# Patient Record
Sex: Male | Born: 1950 | Race: White | Hispanic: No | Marital: Married | Smoking: Never smoker
Health system: Southern US, Community
[De-identification: ages and names within clinical notes are randomized; demographics above are authoritative.]

## PROBLEM LIST (undated history)

## (undated) DIAGNOSIS — T7840XA Allergy, unspecified, initial encounter: Secondary | ICD-10-CM

## (undated) HISTORY — DX: Allergy, unspecified, initial encounter: T78.40XA

## (undated) HISTORY — PX: TONSILLECTOMY: SUR1361

## (undated) HISTORY — PX: INGUINAL HERNIA REPAIR: SUR1180

---

## 2014-11-05 ENCOUNTER — Ambulatory Visit: Payer: Federal, State, Local not specified - PPO | Admitting: *Deleted

## 2014-11-05 ENCOUNTER — Ambulatory Visit
Admission: RE | Admit: 2014-11-05 | Discharge: 2014-11-05 | Disposition: A | Discharge status: Home / Self Care | Payer: Federal, State, Local not specified - PPO | Source: Ambulatory Visit | Attending: Unknown Physician Specialty | Admitting: Unknown Physician Specialty

## 2014-11-05 ENCOUNTER — Encounter
Admission: RE | Disposition: A | Discharge status: Home / Self Care | Payer: Self-pay | Source: Ambulatory Visit | Attending: Unknown Physician Specialty

## 2014-11-05 ENCOUNTER — Encounter: Payer: Self-pay | Admitting: *Deleted

## 2014-11-05 DIAGNOSIS — K640 First degree hemorrhoids: Secondary | ICD-10-CM | POA: Insufficient documentation

## 2014-11-05 DIAGNOSIS — K5730 Diverticulosis of large intestine without perforation or abscess without bleeding: Secondary | ICD-10-CM | POA: Insufficient documentation

## 2014-11-05 DIAGNOSIS — Z1211 Encounter for screening for malignant neoplasm of colon: Secondary | ICD-10-CM | POA: Insufficient documentation

## 2014-11-05 DIAGNOSIS — Z7982 Long term (current) use of aspirin: Secondary | ICD-10-CM | POA: Insufficient documentation

## 2014-11-05 HISTORY — PX: COLONOSCOPY WITH PROPOFOL: SHX5780

## 2014-11-05 SURGERY — COLONOSCOPY WITH PROPOFOL
Anesthesia: General

## 2014-11-05 MED ORDER — SODIUM CHLORIDE 0.9 % IV SOLN: INTRAVENOUS | Status: DC

## 2014-11-05 MED ORDER — PROPOFOL INFUSION 10 MG/ML OPTIME
INTRAVENOUS | Status: DC | PRN
  Administered 2014-11-05: 120 ug/kg/min via INTRAVENOUS

## 2014-11-05 MED ORDER — SODIUM CHLORIDE 0.9 % IV SOLN
INTRAVENOUS | Status: DC
  Administered 2014-11-05: 1000 mL via INTRAVENOUS
  Administered 2014-11-05: 15:00:00 via INTRAVENOUS

## 2014-11-05 MED ORDER — LIDOCAINE HCL (CARDIAC) 20 MG/ML IV SOLN
INTRAVENOUS | Status: DC | PRN
  Administered 2014-11-05: 60 mg via INTRAVENOUS

## 2014-11-05 MED ORDER — EPHEDRINE SULFATE 50 MG/ML IJ SOLN
INTRAMUSCULAR | Status: DC | PRN
  Administered 2014-11-05: 10 mg via INTRAVENOUS

## 2014-11-05 MED ORDER — PROPOFOL 10 MG/ML IV BOLUS
INTRAVENOUS | Status: DC | PRN
  Administered 2014-11-05: 90 mg via INTRAVENOUS

## 2014-11-05 MED ORDER — MIDAZOLAM HCL 2 MG/2ML IJ SOLN
INTRAMUSCULAR | Status: DC | PRN
  Administered 2014-11-05: 1 mg via INTRAVENOUS

## 2014-11-05 NOTE — Anesthesia Preprocedure Evaluation (Signed)
Anesthesia Evaluation  Patient identified by MRN, date of birth, ID band Patient awake    Reviewed: Allergy & Precautions, NPO status , Patient's Chart, lab work & pertinent test results  Airway Mallampati: I  TM Distance: >3 FB Neck ROM: Full    Dental  (+) Teeth Intact   Pulmonary    Pulmonary exam normal       Cardiovascular Exercise Tolerance: Good negative cardio ROS Normal cardiovascular exam    Neuro/Psych    GI/Hepatic   Endo/Other    Renal/GU      Musculoskeletal   Abdominal Normal abdominal exam  (+)   Peds  Hematology   Anesthesia Other Findings   Reproductive/Obstetrics                             Anesthesia Physical Anesthesia Plan  ASA: I  Anesthesia Plan: General   Post-op Pain Management:    Induction: Intravenous  Airway Management Planned: Nasal Cannula  Additional Equipment:   Intra-op Plan:   Post-operative Plan:   Informed Consent: I have reviewed the patients History and Physical, chart, labs and discussed the procedure including the risks, benefits and alternatives for the proposed anesthesia with the patient or authorized representative who has indicated his/her understanding and acceptance.     Plan Discussed with: CRNA  Anesthesia Plan Comments:         Anesthesia Quick Evaluation

## 2014-11-05 NOTE — Transfer of Care (Signed)
Immediate Anesthesia Transfer of Care Note  Patient: Travis Fernandez  Procedure(s) Performed: Procedure(s): COLONOSCOPY WITH PROPOFOL (N/A)  Patient Location: PACU  Anesthesia Type:General  Level of Consciousness: sedated  Airway & Oxygen Therapy: Patient Spontanous Breathing and Patient connected to nasal cannula oxygen  Post-op Assessment: Report given to RN  Post vital signs: Reviewed and stable  Last Vitals:  Filed Vitals:   11/05/14 1528  BP: 86/59  Pulse: 59  Temp: 36.3 C  Resp: 16    Complications: No apparent anesthesia complications

## 2014-11-05 NOTE — H&P (Signed)
Primary Care Physician:  Barbette Reichmann, MD Primary Gastroenterologist:  Dr. Mechele Collin  Pre-Procedure History & Physical: HPI:  Travis Fernandez is a 64 y.o. male is here for an colonoscopy.   History reviewed. No pertinent past medical history.  Past Surgical History  Procedure Laterality Date  . Inguinal hernia repair Left   . Tonsillectomy      Prior to Admission medications   Medication Sig Start Date End Date Taking? Authorizing Provider  aspirin 81 MG tablet Take 81 mg by mouth daily.   Yes Historical Provider, MD    Allergies as of 09/29/2014  . (Not on File)    History reviewed. No pertinent family history.  History   Social History  . Marital Status: Married    Spouse Name: N/A  . Number of Children: N/A  . Years of Education: N/A   Occupational History  . Not on file.   Social History Main Topics  . Smoking status: Never Smoker   . Smokeless tobacco: Not on file  . Alcohol Use: No  . Drug Use: No  . Sexual Activity: Not on file   Other Topics Concern  . Not on file   Social History Narrative  . No narrative on file    Review of Systems: See HPI, otherwise negative ROS  Physical Exam: BP 88/61 mmHg  Pulse 60  Temp(Src) 97.4 F (36.3 C) (Tympanic)  Resp 16  Ht 5\' 5"  (1.651 m)  Wt 69.4 kg (153 lb)  BMI 25.46 kg/m2  SpO2 98% General:   Alert,  pleasant and cooperative in NAD Head:  Normocephalic and atraumatic. Neck:  Supple; no masses or thyromegaly. Lungs:  Clear throughout to auscultation.    Heart:  Regular rate and rhythm. Abdomen:  Soft, nontender and nondistended. Normal bowel sounds, without guarding, and without rebound.   Neurologic:  Alert and  oriented x4;  grossly normal neurologically.  Impression/Plan: Travis Fernandez is here for an colonoscopy to be performed for screening   Risks, benefits, limitations, and alternatives regarding  colonoscopy have been reviewed with the patient.  Questions have been answered.  All parties  agreeable.   Lynnae Prude, MD  11/05/2014, 3:42 PM   Primary Care Physician:  Barbette Reichmann, MD Primary Gastroenterologist:  Dr. Mechele Collin  Pre-Procedure History & Physical: HPI:  Travis Fernandez is a 64 y.o. male is here for an colonoscopy.   History reviewed. No pertinent past medical history.  Past Surgical History  Procedure Laterality Date  . Inguinal hernia repair Left   . Tonsillectomy      Prior to Admission medications   Medication Sig Start Date End Date Taking? Authorizing Provider  aspirin 81 MG tablet Take 81 mg by mouth daily.   Yes Historical Provider, MD    Allergies as of 09/29/2014  . (Not on File)    History reviewed. No pertinent family history.  History   Social History  . Marital Status: Married    Spouse Name: N/A  . Number of Children: N/A  . Years of Education: N/A   Occupational History  . Not on file.   Social History Main Topics  . Smoking status: Never Smoker   . Smokeless tobacco: Not on file  . Alcohol Use: No  . Drug Use: No  . Sexual Activity: Not on file   Other Topics Concern  . Not on file   Social History Narrative  . No narrative on file    Review of Systems: See HPI, otherwise negative ROS  Physical  Exam: BP 88/61 mmHg  Pulse 60  Temp(Src) 97.4 F (36.3 C) (Tympanic)  Resp 16  Ht  (1.651 m)  Wt 69.4 kg (153 lb)  BMI 25.46 kg/m2  SpO2 98% General:   Alert,  pleasant and cooperative in NAD Head:  Normocephalic and atraumatic. Neck:  Supple; no masses or thyromegaly. Lungs:  Clear throughout to auscultation.    Heart:  Regular rate and rhythm. Abdomen:  Soft, nontender and nondistended. Normal bowel sounds, without guarding, and without rebound.   Neurologic:  Alert and  oriented x4;  grossly normal neurologically.  Impression/Plan: Travis Fernandez is here for an colonoscopy to be performed for screening  Risks, benefits, limitations, and alternatives regarding  colonoscopy have been reviewed with the  patient.  Questions have been answered.  All parties agreeable.   Lynnae Prude, MD  11/05/2014, 3:42 PM

## 2014-11-05 NOTE — Anesthesia Postprocedure Evaluation (Signed)
  Anesthesia Post-op Note  Patient: Travis Fernandez  Procedure(s) Performed: Procedure(s): COLONOSCOPY WITH PROPOFOL (N/A)  Anesthesia type:General  Patient location: PACU  Post pain: Pain level controlled  Post assessment: Post-op Vital signs reviewed, Patient's Cardiovascular Status Stable, Respiratory Function Stable, Patent Airway and No signs of Nausea or vomiting  Post vital signs: Reviewed and stable  Last Vitals:  Filed Vitals:   11/05/14 1358  BP: 139/86  Pulse: 84  Temp: 37.2 C  Resp: 20    Level of consciousness: awake, alert  and patient cooperative  Complications: No apparent anesthesia complications

## 2014-11-05 NOTE — Op Note (Signed)
[facility] Gastroenterology Patient Name: Travis Fernandez Procedure Date: 11/05/2014 2:40 PM MRN: [id number] Account #: [id number] Date of Birth: Aug 16, 1950 Admit Type: Outpatient Age: 64 Room: [facility] ENDO ROOM 1 Gender: Male Note Status: Finalized Procedure:         Colonoscopy Indications:       Screening for colorectal malignant neoplasm Providers:         Scot Jun, MD Referring MD:      Barbette Reichmann, MD (Referring MD) Medicines:         Propofol per Anesthesia Complications:     No immediate complications. Procedure:         Pre-Anesthesia Assessment:                    - After reviewing the risks and benefits, the patient was                     deemed in satisfactory condition to undergo the procedure.                    After obtaining informed consent, the colonoscope was                     passed under direct vision. Throughout the procedure, the                     patient's blood pressure, pulse, and oxygen saturations                     were monitored continuously. The Colonoscope was                     introduced through the anus and advanced to the the cecum,                     identified by appendiceal orifice and ileocecal valve. The                     colonoscopy was performed without difficulty. The patient                     tolerated the procedure well. The quality of the bowel                     preparation was good. Findings:      Multiple small-mouthed diverticula were found in the sigmoid colon.      Internal hemorrhoids were found during endoscopy. The hemorrhoids were       small, medium-sized and Grade I (internal hemorrhoids that do not       prolapse).      The exam was otherwise without abnormality. Impression:        - Diverticulosis in the sigmoid colon.                    - Internal hemorrhoids.                    - The examination was otherwise normal.                    - No specimens collected. Recommendation:     - Repeat colonoscopy in 10 years for screening purposes. Scot Jun, MD 11/05/2014 3:23:55 PM This report has been signed electronically. Number of Addenda: 0 Note Initiated On: 11/05/2014 2:40  PM Scope Withdrawal Time: 0 hours 8 minutes 15 seconds  Total Procedure Duration: 0 hours 17 minutes 30 seconds       [facility]

## 2014-11-08 ENCOUNTER — Encounter: Payer: Self-pay | Admitting: Unknown Physician Specialty

## 2017-06-20 ENCOUNTER — Other Ambulatory Visit: Payer: Self-pay | Admitting: Internal Medicine

## 2017-06-20 DIAGNOSIS — H9319 Tinnitus, unspecified ear: Secondary | ICD-10-CM

## 2017-06-20 DIAGNOSIS — G44221 Chronic tension-type headache, intractable: Secondary | ICD-10-CM

## 2017-07-04 ENCOUNTER — Ambulatory Visit: Payer: Federal, State, Local not specified - PPO

## 2017-07-08 ENCOUNTER — Ambulatory Visit
Admission: RE | Admit: 2017-07-08 | Discharge: 2017-07-08 | Disposition: A | Discharge status: Home / Self Care | Payer: Medicare Other | Source: Ambulatory Visit | Attending: Internal Medicine | Admitting: Internal Medicine

## 2017-07-08 DIAGNOSIS — R51 Headache: Secondary | ICD-10-CM | POA: Insufficient documentation

## 2017-07-08 DIAGNOSIS — H9319 Tinnitus, unspecified ear: Secondary | ICD-10-CM | POA: Insufficient documentation

## 2017-07-08 DIAGNOSIS — G44221 Chronic tension-type headache, intractable: Secondary | ICD-10-CM

## 2017-08-21 ENCOUNTER — Other Ambulatory Visit: Payer: Self-pay | Admitting: Internal Medicine

## 2017-08-21 DIAGNOSIS — Z125 Encounter for screening for malignant neoplasm of prostate: Secondary | ICD-10-CM

## 2017-08-21 DIAGNOSIS — N058 Unspecified nephritic syndrome with other morphologic changes: Secondary | ICD-10-CM

## 2017-08-21 DIAGNOSIS — N400 Enlarged prostate without lower urinary tract symptoms: Secondary | ICD-10-CM

## 2017-08-21 DIAGNOSIS — N289 Disorder of kidney and ureter, unspecified: Secondary | ICD-10-CM

## 2017-08-26 ENCOUNTER — Ambulatory Visit
Admission: RE | Admit: 2017-08-26 | Discharge: 2017-08-26 | Disposition: A | Discharge status: Home / Self Care | Payer: Medicare Other | Source: Ambulatory Visit | Attending: Internal Medicine | Admitting: Internal Medicine

## 2017-08-26 DIAGNOSIS — N058 Unspecified nephritic syndrome with other morphologic changes: Secondary | ICD-10-CM | POA: Diagnosis not present

## 2017-08-26 DIAGNOSIS — N281 Cyst of kidney, acquired: Secondary | ICD-10-CM | POA: Insufficient documentation

## 2017-08-26 DIAGNOSIS — N289 Disorder of kidney and ureter, unspecified: Secondary | ICD-10-CM

## 2017-08-26 DIAGNOSIS — N400 Enlarged prostate without lower urinary tract symptoms: Secondary | ICD-10-CM | POA: Insufficient documentation

## 2017-08-26 DIAGNOSIS — Z125 Encounter for screening for malignant neoplasm of prostate: Secondary | ICD-10-CM | POA: Diagnosis not present

## 2017-10-11 ENCOUNTER — Encounter: Payer: Medicare Other | Attending: Internal Medicine | Admitting: Dietician

## 2017-10-11 ENCOUNTER — Encounter: Payer: Self-pay | Admitting: Dietician

## 2017-10-11 VITALS — Ht 65.0 in | Wt 157.8 lb

## 2017-10-11 DIAGNOSIS — Z888 Allergy status to other drugs, medicaments and biological substances status: Secondary | ICD-10-CM | POA: Diagnosis not present

## 2017-10-11 DIAGNOSIS — F419 Anxiety disorder, unspecified: Secondary | ICD-10-CM | POA: Insufficient documentation

## 2017-10-11 DIAGNOSIS — F329 Major depressive disorder, single episode, unspecified: Secondary | ICD-10-CM | POA: Diagnosis not present

## 2017-10-11 DIAGNOSIS — Z880 Allergy status to penicillin: Secondary | ICD-10-CM | POA: Diagnosis not present

## 2017-10-11 DIAGNOSIS — N400 Enlarged prostate without lower urinary tract symptoms: Secondary | ICD-10-CM | POA: Diagnosis not present

## 2017-10-11 DIAGNOSIS — N058 Unspecified nephritic syndrome with other morphologic changes: Secondary | ICD-10-CM | POA: Diagnosis not present

## 2017-10-11 DIAGNOSIS — E876 Hypokalemia: Secondary | ICD-10-CM | POA: Diagnosis not present

## 2017-10-11 DIAGNOSIS — N289 Disorder of kidney and ureter, unspecified: Secondary | ICD-10-CM

## 2017-10-11 DIAGNOSIS — Z91013 Allergy to seafood: Secondary | ICD-10-CM | POA: Diagnosis not present

## 2017-10-11 DIAGNOSIS — Z713 Dietary counseling and surveillance: Secondary | ICD-10-CM | POA: Insufficient documentation

## 2017-10-11 NOTE — Patient Instructions (Addendum)
   Aim for 64 oz of fluid per day, low/zero sugar ideally   Low sugar and fat options contain 10g or less per serving  200 mg per serving of an item is considered a lower salt option  Start to include more fruits and vegetables into the diet. Smoothies are a great way to do this  Freeze items like bananas or butternut squash to add  Serving of vegetables: 1/2 cup cooked, 1 cup raw  Consider adding some vegetarian / meatless meals into the week  Low potassium diet: 2000mg  / day  Normal daily recommendations: 3500-4700mg / day

## 2017-10-11 NOTE — Progress Notes (Signed)
Medical Nutrition Therapy: Visit start time: 1030  end time: 1130  Assessment:  Diagnosis: renal insufficiency, hypokalemia Past medical history: anxiety Psychosocial issues/ stress concerns: none  Preferred learning method:  . Auditory . Visual . Hands-on  Current weight: 157.8#  Height: 5\' 5"  Medications, supplements: Fish oil, Saw palmetto, MVI occasionally  Progress and evaluation: Patient is concerned about recent lab values: Creat 1.3, LDL 133, GFR 55. His potassium levels have been fluctuating from low to WNL in recent years as well. Interested in learning about nutrition strategies to improve renal function. Reports to have cysts on both of his kidneys which he suspects may be affecting their function. In order to improve his health in general he has started to incorporate a fish oil supplement, adds anti-inflammatory spices to his foods such as tumeric and cinnamon, does not drink caffeine or soda, and until recently he had been choosing whole grain options. After learning of his renal insufficiency he started to monitor his phosphorus, protein, potassium, sodium and cut out whole grains but is unsure if he needs to be doing so. He and his wife have started to think about including more fruits and vegetables in their diets. Per wife, they do not cook with much salt and eat most meals at home.   Physical activity: walk or cycles for 1 hour 5 days per week  Dietary Intake:  Usual eating pattern includes 3 meals and 2-3 snacks per day. Dining out frequency: 1-2 meals per week.  Breakfast: low sugar cereal with almond milk + honey + blueberries, granola mixed with cereal sometimes, fruit smoothie with spinach Snack: not usually Lunch: sandwich (pb&j, chicken salad, tuna salad, [place]urkey and cheese with honey mustard) Snack: carrots, chips (less often now), peanut butter, unsalted nuts, candy bars occasionally Supper: veggies burgers, meat (chicken mostly, red meat less often recently,  ham), corn or other starchy vegetable, beans, rice, baked potatoes, tomatoes & cucumber Snack: smoothie (grapes, pineapple, apple, peaches, spinach with almond milk) Beverages: water, 1/2 diluted juice  Nutrition Care Education: Topics covered: identifying high sodium foods, dietary sources of potassium and protein, low fat and low sugar guidelines, ways to incorporate more fruits and vegetables into the diet, daily fluid recommendations Basic nutrition: basic food groups, appropriate nutrient balance, appropriate meal and snack schedule, general nutrition guidelines Advanced nutrition: cooking techniques, dining out, food label reading Hyperlipidemia: healthy and unhealthy fats, role of fiber and foods that contain dietary fiber Other lifestyle changes: benefits of making changes, readiness for change, identifying habits that need to change  Nutritional Diagnosis:  [place]-2.2 Altered nutrition-related laboratory As related to renal insufficiency, dietary habits.  As evidenced by Creat 1.3, LDL 133, GFR 55.  Intervention: Discussion as noted above. Patient and his wife agree to start monitoring sodium and fluid intake. They also plan to continue to increase their intake of fruits and vegetables, along with becoming more conscious of protein and potassium intake. They have started to read nutrition facts labels to identify several of these factors.   Education Materials given:  Marland Kitchen. Salt-free seasoning blends for cooking . General diet guidelines for Low Sodium Diet . Low, Moderate, High Potassium Food List . Goals/ instructions  Learner/ who was taught:  . Patient  . Spouse/ partner: wife  Level of understanding: Marland Kitchen. Verbalizes/ demonstrates competency  Demonstrated degree of understanding via:   Teach back Learning barriers: . None  Willingness to learn/ readiness for change: . Eager, change in progress  Monitoring and Evaluation:  Dietary intake, exercise,  renal labs, and body  weight      follow up: prn

## 2018-10-09 DIAGNOSIS — Z8249 Family history of ischemic heart disease and other diseases of the circulatory system: Secondary | ICD-10-CM | POA: Insufficient documentation

## 2020-05-30 ENCOUNTER — Ambulatory Visit (INDEPENDENT_AMBULATORY_CARE_PROVIDER_SITE_OTHER): Payer: Medicare Other | Admitting: Urology

## 2020-05-30 ENCOUNTER — Encounter: Payer: Self-pay | Admitting: Urology

## 2020-05-30 ENCOUNTER — Other Ambulatory Visit: Payer: Self-pay

## 2020-05-30 VITALS — BP 152/92 | HR 91 | Ht 65.0 in | Wt 148.0 lb

## 2020-05-30 DIAGNOSIS — R350 Frequency of micturition: Secondary | ICD-10-CM | POA: Diagnosis not present

## 2020-05-30 LAB — URINALYSIS, COMPLETE
Bilirubin, UA: NEGATIVE
Glucose, UA: NEGATIVE
Ketones, UA: NEGATIVE
Leukocytes,UA: NEGATIVE
Nitrite, UA: NEGATIVE
Protein,UA: NEGATIVE
RBC, UA: NEGATIVE
Specific Gravity, UA: 1.025 (ref 1.005–1.030)
Urobilinogen, Ur: 0.2 mg/dL (ref 0.2–1.0)
pH, UA: 5 (ref 5.0–7.5)

## 2020-05-30 LAB — MICROSCOPIC EXAMINATION
Epithelial Cells (non renal): NONE SEEN /hpf (ref 0–10)
RBC, Urine: NONE SEEN /hpf (ref 0–2)

## 2020-05-30 LAB — BLADDER SCAN AMB NON-IMAGING: Scan Result: 24

## 2020-05-30 NOTE — Progress Notes (Signed)
05/30/2020 8:42 AM   Travis Fernandez 01-07-1951 [id number]  Referring provider: Barbette Reichmann, MD [address] [facility] [place],  [place] [address]  Chief Complaint  Patient presents with  . Urinary Frequency    HPI: I was consulted to assess the patient for increased urinary frequency.  He can void every 30 to 60 minutes if he concentrates on it.  Otherwise he can hold it for 2 hours.  He gets up about twice at night.  He does have insomnia.  Flow is reasonable.  He never tried Flomax since he was concerned with his renal function.  Recent PSA 0.91.  Urine culture negative.  Glomerular filtration rate 55 mL/min with a serum creatinine of 1.3 in 2019 patient had normal kidneys with 11 cm simple cyst midpole right kidney.  No history of kidney stones bladder surgery or bladder infections.  Bowel movements normal.  No neurologic issues   PMH: No past medical history on file.  Surgical History: Past Surgical History:  Procedure Laterality Date  . COLONOSCOPY WITH PROPOFOL N/A 11/05/2014   Procedure: COLONOSCOPY WITH PROPOFOL;  Surgeon: Scot Jun, MD;  Location: [facility] ENDOSCOPY;  Service: Endoscopy;  Laterality: N/A;  . INGUINAL HERNIA REPAIR Left   . TONSILLECTOMY      Home Medications:  Allergies as of 05/30/2020      Reactions   Shellfish-derived Products    Penicillin G Rash   Prednisone Rash      Medication List       Accurate as of May 30, 2020  8:42 AM. If you have any questions, ask your nurse or doctor.        aspirin 81 MG tablet Take 81 mg by mouth daily.       Allergies:  Allergies  Allergen Reactions  . Shellfish-Derived Products   . Penicillin G Rash  . Prednisone Rash    Family History: No family history on file.  Social History:  reports that he has never smoked. He has never used smokeless tobacco. He reports that he does not drink alcohol and does not use drugs.  ROS:                                         Physical Exam: There were no vitals taken for this visit.  Constitutional:  Alert and oriented, No acute distress. HEENT: [place] AT, moist mucus membranes.  Trachea midline, no masses. Cardiovascular: No clubbing, cyanosis, or edema. Respiratory: Normal respiratory effort, no increased work of breathing. GI: Abdomen is soft, nontender, nondistended, no abdominal masses GU: No CVA tenderness.  40 to 50 g benign prostate Skin: No rashes, bruises or suspicious lesions. Lymph: No cervical or inguinal adenopathy. Neurologic: Grossly intact, no focal deficits, moving all 4 extremities. Psychiatric: Normal mood and affect.  Laboratory Data: No results found for: WBC, HGB, HCT, MCV, PLT  No results found for: CREATININE  No results found for: PSA  No results found for: TESTOSTERONE  No results found for: HGBA1C  Urinalysis No results found for: COLORURINE, APPEARANCEUR, LABSPEC, PHURINE, GLUCOSEU, HGBUR, BILIRUBINUR, KETONESUR, PROTEINUR, UROBILINOGEN, NITRITE, LEUKOCYTESUR  Pertinent Imaging: Urine reviewed.  Chart reviewed.  Bladder scan residual 24 mL  Assessment & Plan: Patient has an overactive bladder.  Role of medications discussed.  Role of watchful waiting discussed.  Follow-up with primary care regarding renal function.  I do not think he  needs another renal ultrasound.  I think the patient made a good choice by choosing watchful waiting.  I will see him as needed  1. Urinary frequency  - Urinalysis, Complete   No follow-ups on file.  Martina Sinner, MD  [facility] [address] [place], [place] [address] [phone]

## 2020-08-16 ENCOUNTER — Ambulatory Visit: Payer: Medicare Other | Admitting: Physician Assistant

## 2020-10-27 ENCOUNTER — Other Ambulatory Visit: Payer: Self-pay | Admitting: Physician Assistant

## 2020-10-27 ENCOUNTER — Other Ambulatory Visit: Payer: Self-pay | Admitting: Cardiology

## 2020-10-27 DIAGNOSIS — Z9189 Other specified personal risk factors, not elsewhere classified: Secondary | ICD-10-CM

## 2020-10-27 DIAGNOSIS — R072 Precordial pain: Secondary | ICD-10-CM

## 2020-10-27 DIAGNOSIS — E785 Hyperlipidemia, unspecified: Secondary | ICD-10-CM

## 2020-10-27 DIAGNOSIS — Z136 Encounter for screening for cardiovascular disorders: Secondary | ICD-10-CM

## 2020-11-02 ENCOUNTER — Other Ambulatory Visit: Payer: Self-pay

## 2020-11-02 ENCOUNTER — Ambulatory Visit
Admission: RE | Admit: 2020-11-02 | Discharge: 2020-11-02 | Disposition: A | Discharge status: Home / Self Care | Payer: Medicare Other | Source: Ambulatory Visit | Attending: Physician Assistant | Admitting: Physician Assistant

## 2020-11-02 DIAGNOSIS — E785 Hyperlipidemia, unspecified: Secondary | ICD-10-CM | POA: Insufficient documentation

## 2020-11-02 DIAGNOSIS — Z136 Encounter for screening for cardiovascular disorders: Secondary | ICD-10-CM | POA: Insufficient documentation

## 2020-11-02 DIAGNOSIS — Z9189 Other specified personal risk factors, not elsewhere classified: Secondary | ICD-10-CM | POA: Insufficient documentation

## 2020-11-15 ENCOUNTER — Other Ambulatory Visit: Payer: Self-pay | Admitting: Internal Medicine

## 2020-11-15 DIAGNOSIS — N1831 Chronic kidney disease, stage 3a: Secondary | ICD-10-CM

## 2020-12-01 ENCOUNTER — Ambulatory Visit
Admission: RE | Admit: 2020-12-01 | Discharge: 2020-12-01 | Disposition: A | Discharge status: Home / Self Care | Payer: Medicare Other | Source: Ambulatory Visit | Attending: Internal Medicine | Admitting: Internal Medicine

## 2020-12-01 ENCOUNTER — Other Ambulatory Visit: Payer: Self-pay

## 2020-12-01 DIAGNOSIS — N1831 Chronic kidney disease, stage 3a: Secondary | ICD-10-CM | POA: Insufficient documentation

## 2021-05-23 ENCOUNTER — Ambulatory Visit (INDEPENDENT_AMBULATORY_CARE_PROVIDER_SITE_OTHER): Payer: Medicare Other | Admitting: Vascular Surgery

## 2021-05-23 ENCOUNTER — Encounter (INDEPENDENT_AMBULATORY_CARE_PROVIDER_SITE_OTHER): Payer: Self-pay | Admitting: Vascular Surgery

## 2021-05-23 ENCOUNTER — Other Ambulatory Visit: Payer: Self-pay

## 2021-05-23 DIAGNOSIS — I83891 Varicose veins of right lower extremities with other complications: Secondary | ICD-10-CM | POA: Diagnosis not present

## 2021-05-23 NOTE — Progress Notes (Signed)
Patient ID: Travis Fernandez, male   DOB: July 29, 1950, 71 y.o.   MRN: [id number]  Chief Complaint  Patient presents with   New Patient (Initial Visit)    Ref Hande right le varicose veins    HPI Travis Fernandez is a 71 y.o. male.  I am asked to see the patient by Dr. Ginette Pitman for evaluation of large varicose veins of the right lower extremity.  The patient has had these for many years and they have been gradually worsening.  He really does not have a lot of pain or swelling in his legs.  He may notice a little bit of heaviness and fatigue in that leg, but he says it really is not bothering him much at all.  No open wounds or infection.  No chest pain or shortness of breath.  No fevers or chills.  No previous history of DVT or superficial thrombophlebitis to his knowledge.     Past Medical History:  Diagnosis Date   Allergy     Past Surgical History:  Procedure Laterality Date   COLONOSCOPY WITH PROPOFOL N/A 11/05/2014   Procedure: COLONOSCOPY WITH PROPOFOL;  Surgeon: Manya Silvas, MD;  Location: [facility] ENDOSCOPY;  Service: Endoscopy;  Laterality: N/A;   INGUINAL HERNIA REPAIR Left    TONSILLECTOMY       Family History  Problem Relation Age of Onset   Stroke Mother    Stroke Father    Heart attack Father   No bleeding or clotting disorders   Social History   Tobacco Use   Smoking status: Never   Smokeless tobacco: Never  Substance Use Topics   Alcohol use: No   Drug use: No     Allergies  Allergen Reactions   Erythromycin Diarrhea   Shellfish-Derived Products    Niacin Hives and Rash    Full body Full body    Penicillin G Rash   Prednisone Rash    Current Outpatient Medications  Medication Sig Dispense Refill   Ascorbic Acid 500 MG CHEW Take 1 tablet (500 mg total) by mouth once daily     cyanocobalamin 1000 MCG tablet Take 1 tablet (1,000 mcg total) by mouth once daily     Magnesium 200 MG TABS Take 3 tablets by mouth daily.     Menaquinone-7 (K2-45 PO) Take by  mouth daily.     Omega-3 Fatty Acids (FISH OIL) 1000 MG CAPS Take 3 capsules by mouth daily.     saw palmetto 160 MG capsule Take 160 mg by mouth 2 (two) times daily.     vitamin E 180 MG (400 UNITS) capsule Take 400 Units by mouth daily.     No current facility-administered medications for this visit.      REVIEW OF SYSTEMS (Negative unless checked)  Constitutional: [] Weight loss  [] Fever  [] Chills Cardiac: [] Chest pain   [] Chest pressure   [] Palpitations   [] Shortness of breath when laying flat   [] Shortness of breath at rest   [] Shortness of breath with exertion. Vascular:  [] Pain in legs with walking   [] Pain in legs at rest   [] Pain in legs when laying flat   [] Claudication   [] Pain in feet when walking  [] Pain in feet at rest  [] Pain in feet when laying flat   [] History of DVT   [] Phlebitis   [] Swelling in legs   [x] Varicose veins   [] Non-healing ulcers Pulmonary:   [] Uses home oxygen   [] Productive cough   [] Hemoptysis   [] Wheeze  []   COPD   [] Asthma Neurologic:  [] Dizziness  [] Blackouts   [] Seizures   [] History of stroke   [] History of TIA  [] Aphasia   [] Temporary blindness   [] Dysphagia   [] Weakness or numbness in arms   [] Weakness or numbness in legs Musculoskeletal:  [] Arthritis   [] Joint swelling   [] Joint pain   [] Low back pain Hematologic:  [] Easy bruising  [] Easy bleeding   [] Hypercoagulable state   [] Anemic  [] Hepatitis Gastrointestinal:  [] Blood in stool   [] Vomiting blood  [] Gastroesophageal reflux/heartburn   [] Abdominal pain Genitourinary:  [] Chronic kidney disease   [] Difficult urination  [] Frequent urination  [] Burning with urination   [] Hematuria Skin:  [] Rashes   [] Ulcers   [] Wounds Psychological:  [] History of anxiety   []  History of major depression.    Physical Exam BP (!) 143/84 (BP Location: Left Arm)    Pulse 83    Resp 16    Ht 5' 5.5" (1.664 m)    Wt 148 lb (67.1 kg)    BMI 24.25 kg/m  Gen:  WD/WN, NAD.  Appears younger than stated age Head: [place]/AT, No  temporalis wasting.  Ear/Nose/Throat: Hearing grossly intact, nares w/o erythema or drainage, oropharynx w/o Erythema/Exudate Eyes: Conjunctiva clear, sclera non-icteric  Neck: trachea midline.  No JVD.  Pulmonary:  Good air movement, respirations not labored, no use of accessory muscles  Cardiac: RRR, no JVD Vascular: Extensive varicosities in the right medial knee and lower leg area with mild stasis dermatitis changes in the right lower extremity.  Scattered varicosities present on the left much smaller. Vessel Right Left  Radial Palpable Palpable                          DP Palpable Palpable  PT Palpable Palpable   Gastrointestinal:. No masses, surgical incisions, or scars. Musculoskeletal: M/S 5/5 throughout.  Extremities without ischemic changes.  No deformity or atrophy.  Varicosities as above.  No significant lower extremity edema. Neurologic: Sensation grossly intact in extremities.  Symmetrical.  Speech is fluent. Motor exam as listed above. Psychiatric: Judgment intact, Mood & affect appropriate for pt's clinical situation. Dermatologic: No rashes or ulcers noted.  No cellulitis or open wounds.    Radiology No results found.  Labs No results found for this or any previous visit (from the past 2160 hour(s)).  Assessment/Plan:  Varicose veins of lower extremities with complications, right The patient has prominent varicosities the right lower extremity but currently does not have much in the way of symptoms.  Does have some heaviness and fatigue.  He is wearing a compression sock and will continue to do so.  He will continue to be active and elevate his legs.  I have offered him a reflux study to evaluate the anatomy and he says his legs start bothering him more, he will have this done for further work-up.      Leotis Pain 05/23/2021, 11:59 AM   This note was created with Dragon medical transcription system.  Any errors from dictation are unintentional.

## 2021-05-23 NOTE — Patient Instructions (Signed)
Varicose Veins °Varicose veins are veins that have become enlarged, bulged, and twisted. They most often appear in the legs. °What are the causes? °This condition is caused by damage to the valves in the vein. These valves help blood return to your heart. When they are damaged and they stop working properly, blood may flow backward and back up in the veins near the skin, causing the veins to get larger and appear twisted. °The condition can result from any issue that causes blood to back up, like pregnancy, prolonged standing, or obesity. °What increases the risk? °The following factors may make you more likely to develop this condition: °Being on your feet a lot. °Being pregnant. °Being overweight. °Smoking. °Having had a previous deep vein thrombosis or having a thrombotic disorder. °Aging. The risk increases with age. °Having a condition called Klippel-Trenaunay syndrome. °What are the signs or symptoms? °Symptoms of this condition include: °Bulging, twisted, and bluish veins. °A feeling of heaviness in your legs. This may be worse at the end of the day. °Leg pain. This may be worse at the end of the day. °Swelling in the leg. °Changes in skin color over the veins. °Swelling or pain in the legs can limit your activities. Your symptoms may get worse when you sit or stand for long periods of time. °How is this diagnosed? °This condition may be diagnosed based on: °Your symptoms, family history, activity levels, and lifestyle. °A physical exam. °You may also have tests, including an ultrasound or X-ray. °How is this treated? °Treatment for this condition may involve: °Avoiding sitting or standing in one position for long periods of time. °Wearing compression stockings. These stockings help to prevent blood clots and reduce swelling in the legs. °Raising (elevating) the legs when resting. °Losing weight. °Exercising regularly. °If you have persistent symptoms or want to improve the way your varicose veins look, you  may choose to have a procedure to close the varicose veins off or to remove them. °Nonsurgical treatments to close off the veins include: °Sclerotherapy. In this treatment, a solution is injected into a vein to close it off. °Laser treatment. The vein is heated with a laser to close it off. °Radiofrequency vein ablation. An electrical current produced by radio waves is used to close off the vein. °Surgical treatments to remove the veins include: °Phlebectomy. In this procedure, the veins are removed through small incisions made over the veins. °Vein ligation and stripping. In this procedure, incisions are made over the veins. The veins are then removed after being tied (ligated) with stitches (sutures). °Follow these instructions at home: °Medicines °Take over-the-counter and prescription medicines only as told by your health care provider. °If you were prescribed an antibiotic medicine, use it as told by your health care provider. Do not stop using the antibiotic even if you start to feel better. °Activity °Walk as much as possible. Walking increases blood flow. This helps blood return to the heart and takes pressure off your veins. °Do not stand or sit in one position for a long period of time. °Do not sit with your legs crossed. °Avoid sitting for a long time without moving. Get up to take short walks every 1-2 hours. This is important to improve blood flow and breathing. Ask for help if you feel weak or unsteady. °Return to your normal activities as told by your health care provider. Ask your health care provider what activities are safe for you. °Do exercises as told by your health care provider. °General instructions ° °  Follow any diet instructions given to you by your health care provider. °Elevate your legs at night to above the level of your heart. °If you get a cut in the skin over the varicose vein and the vein bleeds: °Lie down with your leg raised. °Apply firm pressure to the cut with a clean cloth  until the bleeding stops. °Place a bandage (dressing) on the cut. °Drink enough fluid to keep your urine pale yellow. °Do not use any products that contain nicotine or tobacco. These products include cigarettes, chewing tobacco, and vaping devices, such as e-cigarettes. If you need help quitting, ask your health care provider. °Wear compression stockings as told by your health care provider. Do not wear other kinds of tight clothing around your legs, pelvis, or waist. °Keep all follow-up visits. This is important. °Contact a health care provider if: °The skin around your varicose veins starts to break down. °You have more pain, redness, tenderness, or hard swelling over a vein. °You are uncomfortable because of pain. °You get a cut in the skin over a varicose vein and it will not stop bleeding. °Get help right away if: °You have chest pain. °You have trouble breathing. °You have severe leg pain. °Summary °Varicose veins are veins that have become enlarged, bulged, and twisted. They most often appear in the legs. °This condition is caused by damage to the valves in the vein. These valves help blood return to your heart. °Treatment for this condition includes frequent movements, wearing compression stockings, losing weight, and exercising regularly. In some cases, procedures are done to close off or remove the veins. °Nonsurgical treatments to close off the veins include sclerotherapy, laser therapy, and radiofrequency vein ablation. °This information is not intended to replace advice given to you by your health care provider. Make sure you discuss any questions you have with your health care provider. °Document Revised: 09/07/2020 Document Reviewed: 09/07/2020 °Elsevier Patient Education © 2022 [employer] ° °

## 2021-05-23 NOTE — Assessment & Plan Note (Signed)
The patient has prominent varicosities the right lower extremity but currently does not have much in the way of symptoms.  Does have some heaviness and fatigue.  He is wearing a compression sock and will continue to do so.  He will continue to be active and elevate his legs.  I have offered him a reflux study to evaluate the anatomy and he says his legs start bothering him more, he will have this done for further work-up.

## 2021-07-06 ENCOUNTER — Ambulatory Visit: Payer: Medicare Other | Admitting: Physician Assistant

## 2021-07-10 ENCOUNTER — Other Ambulatory Visit (HOSPITAL_COMMUNITY): Payer: Self-pay | Admitting: Physician Assistant

## 2021-07-10 ENCOUNTER — Other Ambulatory Visit: Payer: Self-pay | Admitting: Physician Assistant

## 2021-07-10 DIAGNOSIS — R1031 Right lower quadrant pain: Secondary | ICD-10-CM

## 2021-07-20 ENCOUNTER — Ambulatory Visit
Admission: RE | Admit: 2021-07-20 | Discharge: 2021-07-20 | Disposition: A | Discharge status: Home / Self Care | Payer: Medicare Other | Source: Ambulatory Visit | Attending: Physician Assistant | Admitting: Physician Assistant

## 2021-07-20 DIAGNOSIS — R1031 Right lower quadrant pain: Secondary | ICD-10-CM | POA: Diagnosis present

## 2021-07-22 ENCOUNTER — Encounter (INDEPENDENT_AMBULATORY_CARE_PROVIDER_SITE_OTHER): Payer: Self-pay | Admitting: Vascular Surgery

## 2021-07-24 ENCOUNTER — Encounter (INDEPENDENT_AMBULATORY_CARE_PROVIDER_SITE_OTHER): Payer: Self-pay

## 2021-07-27 ENCOUNTER — Ambulatory Visit (INDEPENDENT_AMBULATORY_CARE_PROVIDER_SITE_OTHER): Payer: Medicare Other | Admitting: Physician Assistant

## 2021-07-27 DIAGNOSIS — L821 Other seborrheic keratosis: Secondary | ICD-10-CM | POA: Diagnosis not present

## 2021-07-27 DIAGNOSIS — L570 Actinic keratosis: Secondary | ICD-10-CM

## 2021-07-27 DIAGNOSIS — Z1283 Encounter for screening for malignant neoplasm of skin: Secondary | ICD-10-CM | POA: Diagnosis not present

## 2021-07-31 ENCOUNTER — Encounter: Payer: Self-pay | Admitting: Physician Assistant

## 2021-07-31 NOTE — Progress Notes (Signed)
? ?  New Patient ?  ?Subjective  ?Angelis Tomich is a 71 y.o. male who presents for the following: New Patient (Initial Visit) (Here to establish care. Wants full body check. Patients wife wanted him to be checked. No history of skin cancers. ). ? ? ?The following portions of the chart were reviewed this encounter and updated as appropriate:  Tobacco  Allergies  Meds  Problems  Med Hx  Surg Hx  Fam Hx   ?  ? ?Objective  ?Well appearing patient in no apparent distress; mood and affect are within normal limits. ? ?A full examination was performed including scalp, head, eyes, ears, nose, lips, neck, chest, axillae, abdomen, back, buttocks, bilateral upper extremities, bilateral lower extremities, hands, feet, fingers, toes, fingernails, and toenails. All findings within normal limits unless otherwise noted below. ? ?No atypical nevi No signs of non-mole skin cancer.  ? ?Left Ear ?Erythematous patches with gritty scale. ? ?Right Breast ?Stuck-on, crusted papules and plaques.  ? ? ?Assessment & Plan  ?AK (actinic keratosis) ?Left Ear ? ?Destruction of lesion - Left Ear ?Complexity: simple   ?Destruction method: cryotherapy   ?Informed consent: discussed and consent obtained   ?Timeout:  patient name, date of birth, surgical site, and procedure verified ?Lesion destroyed using liquid nitrogen: Yes   ?Cryotherapy cycles:  1 ?Outcome: patient tolerated procedure well with no complications   ?Post-procedure details: wound care instructions given   ? ?Screening for malignant neoplasm of skin ? ?Yearly skin exam. ? ?Seborrheic keratosis ?Right Breast ? ?Ok to leave if stable ? ? ? ? ?I, Jayleigh Notarianni, PA-C, have reviewed all documentation's for this visit.  The documentation on 07/31/21 for the exam, diagnosis, procedures and orders are all accurate and complete. ?

## 2021-09-12 ENCOUNTER — Ambulatory Visit (INDEPENDENT_AMBULATORY_CARE_PROVIDER_SITE_OTHER): Payer: Medicare Other

## 2021-09-12 ENCOUNTER — Ambulatory Visit (INDEPENDENT_AMBULATORY_CARE_PROVIDER_SITE_OTHER): Payer: Medicare Other | Admitting: Vascular Surgery

## 2021-09-12 ENCOUNTER — Other Ambulatory Visit (INDEPENDENT_AMBULATORY_CARE_PROVIDER_SITE_OTHER): Payer: Self-pay | Admitting: Vascular Surgery

## 2021-09-12 ENCOUNTER — Encounter (INDEPENDENT_AMBULATORY_CARE_PROVIDER_SITE_OTHER): Payer: Self-pay | Admitting: Vascular Surgery

## 2021-09-12 VITALS — BP 152/84 | HR 75 | Resp 16

## 2021-09-12 DIAGNOSIS — I83891 Varicose veins of right lower extremities with other complications: Secondary | ICD-10-CM

## 2021-09-12 DIAGNOSIS — I8391 Asymptomatic varicose veins of right lower extremity: Secondary | ICD-10-CM

## 2021-09-12 NOTE — Progress Notes (Signed)
MRN : [id number]  Travis Fernandez is a 71 y.o. (09-17-1950) male who presents with chief complaint of No chief complaint on file. Marland Kitchen  History of Present Illness: Patient returns today in follow up of ***  Current Outpatient Medications  Medication Sig Dispense Refill   Ascorbic Acid 500 MG CHEW Take 1 tablet (500 mg total) by mouth once daily     cyanocobalamin 1000 MCG tablet Take 1 tablet (1,000 mcg total) by mouth once daily     Magnesium 200 MG TABS Take 3 tablets by mouth daily.     Menaquinone-7 (K2-45 PO) Take by mouth daily.     Omega-3 Fatty Acids (FISH OIL) 1000 MG CAPS Take 3 capsules by mouth daily.     saw palmetto 160 MG capsule Take 160 mg by mouth 2 (two) times daily.     vitamin E 180 MG (400 UNITS) capsule Take 400 Units by mouth daily.     No current facility-administered medications for this visit.    Past Medical History:  Diagnosis Date   Allergy     Past Surgical History:  Procedure Laterality Date   COLONOSCOPY WITH PROPOFOL N/A 11/05/2014   Procedure: COLONOSCOPY WITH PROPOFOL;  Surgeon: Scot Jun, MD;  Location: [facility] ENDOSCOPY;  Service: Endoscopy;  Laterality: N/A;   INGUINAL HERNIA REPAIR Left    TONSILLECTOMY       Social History   Tobacco Use   Smoking status: Never   Smokeless tobacco: Never  Substance Use Topics   Alcohol use: No   Drug use: No   ***    Family History  Problem Relation Age of Onset   Stroke Mother    Stroke Father    Heart attack Father    ***  Allergies  Allergen Reactions   Erythromycin Diarrhea   Shellfish-Derived Products    Niacin Hives and Rash    Full body Full body    Penicillin G Rash   Prednisone Rash     REVIEW OF SYSTEMS (Negative unless checked)  Constitutional: [] Weight loss  [] Fever  [] Chills Cardiac: [] Chest pain   [] Chest pressure   [] Palpitations   [] Shortness of breath when laying flat   [] Shortness of breath at rest   [] Shortness of breath with exertion. Vascular:  [] Pain  in legs with walking   [] Pain in legs at rest   [] Pain in legs when laying flat   [] Claudication   [] Pain in feet when walking  [] Pain in feet at rest  [] Pain in feet when laying flat   [] History of DVT   [] Phlebitis   [] Swelling in legs   [x] Varicose veins   [] Non-healing ulcers Pulmonary:   [] Uses home oxygen   [] Productive cough   [] Hemoptysis   [] Wheeze  [] COPD   [] Asthma Neurologic:  [] Dizziness  [] Blackouts   [] Seizures   [] History of stroke   [] History of TIA  [] Aphasia   [] Temporary blindness   [] Dysphagia   [] Weakness or numbness in arms   [] Weakness or numbness in legs Musculoskeletal:  [] Arthritis   [] Joint swelling   [] Joint pain   [] Low back pain Hematologic:  [] Easy bruising  [] Easy bleeding   [] Hypercoagulable state   [] Anemic   Gastrointestinal:  [] Blood in stool   [] Vomiting blood  [] Gastroesophageal reflux/heartburn   [] Abdominal pain Genitourinary:  [] Chronic kidney disease   [] Difficult urination  [] Frequent urination  [] Burning with urination   [] Hematuria Skin:  [] Rashes   [] Ulcers   [] Wounds Psychological:  [] History of anxiety   []   History of major depression.  Physical Examination  There were no vitals taken for this visit. Gen:  WD/WN, NAD Head: [place]/AT, No temporalis wasting. Ear/Nose/Throat: Hearing grossly intact, nares w/o erythema or drainage Eyes: Conjunctiva clear. Sclera non-icteric Neck: Supple.  Trachea midline Pulmonary:  Good air movement, no use of accessory muscles.  Cardiac: RRR, no JVD Vascular:  Vessel Right Left  Radial Palpable Palpable           Musculoskeletal: M/S 5/5 throughout.  No deformity or atrophy. *** edema. Neurologic: Sensation grossly intact in extremities.  Symmetrical.  Speech is fluent.  Psychiatric: Judgment intact, Mood & affect appropriate for pt's clinical situation. Dermatologic: No rashes or ulcers noted.  No cellulitis or open wounds.      Labs No results found for this or any previous visit (from the past 2160  hour(s)).  Radiology No results found.  Assessment/Plan  No problem-specific Assessment & Plan notes found for this encounter.    Festus Barren, MD  09/12/2021 3:36 PM    This note was created with Dragon medical transcription system.  Any errors from dictation are purely unintentional

## 2021-09-13 NOTE — Assessment & Plan Note (Signed)
A venous study was done today which does show reflux in the right great saphenous vein with no DVT or superficial thrombophlebitis.  I do not think his foot pain is related necessarily to his venous disease.  He really has only mild symptoms from his venous insufficiency and does not want to have any intervention which is reasonable.  At this point, he will follow-up as needed

## 2022-01-19 NOTE — Progress Notes (Unsigned)
Referring Physician:  Steffanie Rainwater, MD [place],  [place] [address]  Primary Physician:  Tracie Harrier, MD  History of Present Illness: 01/19/2022 [name] has a history of heart murmur, incomplete RBBB, hyperlipidemia. History of CKD per patient.   Seen by ortho at [facility] on 01/17/22 for left hip pain and was referred here for evaluation of his lumbar spine.   Per ortho note, hip xrays at [facility] showed some medial joint space narrowing superior sclerosis and lateral acetabular osteophyte formation. The right hip has chronic appearing changes consistent with a previous healed femoral neck fracture with impaction joint space narrowing osteophyte formation sclerosis and subchondral cyst formation in the femoral head.   He has intermittent left buttock pain that radiates into groin and lateral thigh x 5-6 months. Pain tends to be worse in the morning and improves as he moves around. No pain past his left knee. Pain is worse with walking. He cannot sleep on left side at night. No pain on right side. No numbness, tingling. Had bad pain on Sunday in left leg and it felt weak.   He has tried neurontin (took 2 pills) with no relief. He does not like to take medications.   Bowel/Bladder Dysfunction: none  Conservative measures:  Physical therapy: none, he is seeing chiropractor since April Multimodal medical therapy including regular antiinflammatories: neurontin Injections: no epidural steroid injections  Past Surgery: no spinal surgery  Travis Fernandez has no symptoms of cervical myelopathy.   Review of Systems:  A 10 point review of systems is negative, except for the pertinent positives and negatives detailed in the HPI.  Past Medical History: Past Medical History:  Diagnosis Date   Allergy     Past Surgical History: Past Surgical History:  Procedure Laterality Date   COLONOSCOPY WITH PROPOFOL N/A 11/05/2014   Procedure: COLONOSCOPY WITH PROPOFOL;  Surgeon:  Manya Silvas, MD;  Location: [facility] ENDOSCOPY;  Service: Endoscopy;  Laterality: N/A;   INGUINAL HERNIA REPAIR Left    TONSILLECTOMY      Allergies: Allergies as of 01/23/2022 - Review Complete 09/12/2021  Allergen Reaction Noted   Erythromycin Diarrhea 04/17/2021   Shellfish-derived products  11/04/2014   Niacin Hives and Rash 05/19/2020   Penicillin g Rash 11/04/2014   Prednisone Rash 11/04/2014    Medications: Outpatient Encounter Medications as of 01/23/2022  Medication Sig   Ascorbic Acid 500 MG CHEW Take 1 tablet (500 mg total) by mouth once daily   cyanocobalamin 1000 MCG tablet Take 1 tablet (1,000 mcg total) by mouth once daily   Magnesium 200 MG TABS Take 3 tablets by mouth daily.   Menaquinone-7 (K2-45 PO) Take by mouth daily.   Omega-3 Fatty Acids (FISH OIL) 1000 MG CAPS Take 3 capsules by mouth daily.   saw palmetto 160 MG capsule Take 160 mg by mouth 2 (two) times daily.   vitamin E 180 MG (400 UNITS) capsule Take 400 Units by mouth daily.   No facility-administered encounter medications on file as of 01/23/2022.    Social History: Social History   Tobacco Use   Smoking status: Never   Smokeless tobacco: Never  Substance Use Topics   Alcohol use: No   Drug use: No    Family Medical History: Family History  Problem Relation Age of Onset   Stroke Mother    Stroke Father    Heart attack Father     Physical Examination: There were no vitals filed for this visit.  General:  Patient is well developed, well nourished, calm, collected, and in no apparent distress. Attention to examination is appropriate.  Respiratory: Patient is breathing without any difficulty.   NEUROLOGICAL:     Awake, alert, oriented to person, place, and time.  Speech is clear and fluent. Fund of knowledge is appropriate.   Cranial Nerves: Pupils equal round and reactive to light.  Facial tone is symmetric.  Facial sensation is symmetric.  ROM of spine:  Good ROM of lumbar  spine without pain  No abnormal lesions on exposed skin.   Strength: Side Biceps Triceps Deltoid Interossei Grip Wrist Ext. Wrist Flex.  R 5 5 5 5 5 5 5   L 5 5 5 5 5 5 5    Side Iliopsoas Quads Hamstring PF DF EHL  R 5 5 5 5 5 5   L 5 5 5 5 5 5    Reflexes are 2+ and symmetric at the biceps, triceps, brachioradialis, patella and achilles.   Hoffman's is absent.  Clonus is not present.   Bilateral upper and lower extremity sensation is intact to light touch.     No pain with IR/ER of bilateral hips.   Gait is normal.     Medical Decision Making  Imaging: Lumbar xrays AP/lat dated 01/17/22:  Mild scoliosis with DDD/spondylosis L3-S1.   No radiology report available for my review.   Assessment and Plan: [name] is a pleasant 72 y.o. male with intermittent left buttock pain that radiates into groin and lateral thigh x 5-6 months. Pain tends to be worse in the morning and improves as he moves around. No pain past his left knee. Pain is worse with walking.   He has known lumbar scoliosis with DDD/spondylosis L3-S1. No pain with IR/ER of left hip. Pain appears to be lumbar mediated.   Treatment options discussed with patient and following plan made:   - PT for lumbar spine. Orders to [facility]. Patient to call to schedule.  - He wants to hold on NSAIDs- was told he had CKD.  - If no improvement with above, consider lumbar MRI and possible lumbar injections.  - Natural history of scoliosis/DDD discussed with patient and all questions answered.   I spent a total of 30 minutes in face-to-face and non-face-to-face activities related to this patient's care today.  Thank you for involving me in the care of this patient.   03/19/22 PA-C Dept. of Neurosurgery

## 2022-01-22 ENCOUNTER — Ambulatory Visit
Admission: RE | Admit: 2022-01-22 | Discharge: 2022-01-22 | Disposition: A | Discharge status: Home / Self Care | Payer: Self-pay | Source: Ambulatory Visit | Attending: Orthopedic Surgery | Admitting: Orthopedic Surgery

## 2022-01-22 ENCOUNTER — Other Ambulatory Visit: Payer: Self-pay

## 2022-01-22 DIAGNOSIS — Z049 Encounter for examination and observation for unspecified reason: Secondary | ICD-10-CM

## 2022-01-23 ENCOUNTER — Encounter: Payer: Self-pay | Admitting: Orthopedic Surgery

## 2022-01-23 ENCOUNTER — Ambulatory Visit (INDEPENDENT_AMBULATORY_CARE_PROVIDER_SITE_OTHER): Payer: Medicare Other | Admitting: Orthopedic Surgery

## 2022-01-23 VITALS — BP 132/82 | Ht 65.5 in | Wt 145.2 lb

## 2022-01-23 DIAGNOSIS — M5136 Other intervertebral disc degeneration, lumbar region: Secondary | ICD-10-CM

## 2022-01-23 DIAGNOSIS — M47816 Spondylosis without myelopathy or radiculopathy, lumbar region: Secondary | ICD-10-CM

## 2022-01-23 DIAGNOSIS — M419 Scoliosis, unspecified: Secondary | ICD-10-CM

## 2022-01-23 DIAGNOSIS — M4726 Other spondylosis with radiculopathy, lumbar region: Secondary | ICD-10-CM

## 2022-01-23 DIAGNOSIS — M5416 Radiculopathy, lumbar region: Secondary | ICD-10-CM

## 2022-01-23 NOTE — Patient Instructions (Signed)
It was so nice to see you today, I am sorry that you are hurting.  Your lower back xrays showed mild scoliosis with arthritis (wear and tear) and I think this may be causing your pain.   I sent physical therapy orders to [facility]. You can call them at [phone] to schedule.   If pain gets worse or no better, we can consider an MRI of your back.   I will see you back in 6 weeks. Please do not hesitate to call if you have any questions or concerns. You can also message me in [place].   Geronimo Boot PA-C [phone]

## 2022-02-05 ENCOUNTER — Encounter (INDEPENDENT_AMBULATORY_CARE_PROVIDER_SITE_OTHER): Payer: Self-pay

## 2022-02-07 DIAGNOSIS — M419 Scoliosis, unspecified: Secondary | ICD-10-CM

## 2022-02-07 DIAGNOSIS — M5136 Other intervertebral disc degeneration, lumbar region: Secondary | ICD-10-CM

## 2022-02-07 DIAGNOSIS — M5416 Radiculopathy, lumbar region: Secondary | ICD-10-CM

## 2022-02-07 DIAGNOSIS — M47816 Spondylosis without myelopathy or radiculopathy, lumbar region: Secondary | ICD-10-CM

## 2022-02-14 ENCOUNTER — Ambulatory Visit
Admission: RE | Admit: 2022-02-14 | Discharge: 2022-02-14 | Disposition: A | Discharge status: Home / Self Care | Payer: Medicare Other | Source: Ambulatory Visit | Attending: Orthopedic Surgery | Admitting: Orthopedic Surgery

## 2022-02-14 DIAGNOSIS — M419 Scoliosis, unspecified: Secondary | ICD-10-CM | POA: Diagnosis present

## 2022-02-14 DIAGNOSIS — M5136 Other intervertebral disc degeneration, lumbar region: Secondary | ICD-10-CM | POA: Diagnosis present

## 2022-02-14 DIAGNOSIS — M5416 Radiculopathy, lumbar region: Secondary | ICD-10-CM | POA: Diagnosis present

## 2022-02-14 DIAGNOSIS — M47816 Spondylosis without myelopathy or radiculopathy, lumbar region: Secondary | ICD-10-CM | POA: Insufficient documentation

## 2022-02-15 ENCOUNTER — Ambulatory Visit: Payer: Medicare Other | Admitting: Orthopedic Surgery

## 2022-02-22 NOTE — Progress Notes (Signed)
Telephone Visit- Progress Note: Referring Physician:  Barbette Reichmann, MD [address] [facility] [place],  [place] [address]  Primary Physician:  Barbette Reichmann, MD  This visit was performed via telephone.  Patient location: home Provider location: office  I spent a total of 15 minutes non-face-to-face activities for this visit on the date of this encounter including review of current clinical condition and response to treatment.   Chief Complaint:  review lumbar MRI  History of Present Illness: Travis Fernandez is a 71 y.o. male is scheduled for a phone visit to review his lumbar MRI   Last seen by me on 01/23/22 for intermittent left buttock pain that radiates into groin and lateral thigh. No pain past his left knee.    He has known lumbar scoliosis with DDD/spondylosis L3-S1. No pain with IR/ER of left hip. Pain appeared to be lumbar mediated.   He has been doing PT (has been to 3 visits) and called with more constant pain. MRI was ordered.   He continues with intermittent left buttock pain that radiates into groin and lateral thigh. No pain past his left knee. Pain is still daily, but is improving since starting PT. He has not had to use ice as much. No numbness or tingling.   Conservative measures:  Physical therapy: going now for lumbar spine (did 3 visits so far). He is seeing chiropractor since April for cervical spine.  Multimodal medical therapy including regular antiinflammatories: neurontin Injections: no epidural steroid injections   Past Surgery: no spinal surgery  Exam: No exam done as this was a telephone encounter.     Imaging: MRI of lumbar spine dated 02/14/22:  FINDINGS: Segmentation: There are five lumbar type vertebral bodies. The last full intervertebral disc space is labeled L5-S1.   Alignment: Right convex lumbar scoliosis and associated degenerative lumbar spondylosis with multilevel disc disease and facet disease.    Vertebrae: No bone lesions or fractures. Endplate reactive changes noted at L4-5. Scattered benign hemangiomas.   Conus medullaris and cauda equina: Conus extends to the L1 level. Conus and cauda equina appear normal.   Paraspinal and other soft tissues: Large bilateral renal cysts previously evaluated with renal ultrasound 2019.   Disc levels:   L1-2: Moderate facet disease but no disc protrusions, spinal or foraminal stenosis.   L2-3: Focal left paracentral disc protrusion with moderate mass effect on the left side of the thecal sac impinging on the left L3 nerve root and likely causing the patient's left radicular symptoms. The exiting L2 nerve roots appear normal. Moderate facet disease.   L3-4: Degenerative disc disease and facet disease with asymmetric mass effect on the left side of the thecal sac and moderate left lateral recess stenosis. No significant spinal stenosis or foraminal stenosis.   L4-5: Bulging degenerated annulus and facet disease but no significant spinal or foraminal stenosis. There is significant far lateral rightward spurring changes potentially affecting the extraforaminal right L4 nerve root.   L5-S1: Bulging annulus and facet disease but no disc protrusions, spinal or foraminal stenosis.   IMPRESSION: 1. Focal left paracentral disc protrusion at L2-3 impinging on the left L3 nerve root and likely causing the patient's left radicular symptoms. 2. Moderate left lateral recess stenosis at L3-4. 3. Far lateral spurring changes on the right at L4-5 potentially affecting the extraforaminal right L4 nerve root.     Electronically Signed   By: Rudie Meyer M.D.   On: 02/18/2022 15:22  I have personally reviewed the images and agree with the above interpretation.   Assessment and Plan: [name] is a pleasant 71 y.o. male with some improvement in pain since starting PT.   He continues with intermittent left buttock pain that radiates  into groin and lateral thigh. No pain past his left knee.  MRI shows left sided disc at L2-L3 impinging on left L3 nerve root. 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 Primary pain generator is likely L2-L3 and L3-L4.   Treatment options discussed with patient and following plan made:   - Continue with PT for lumbar spine.  - Discussed lumbar ESI. He wants to hold off since pain is improving. Can revisit if he gets worse.  - He wants to hold on NSAIDs due to CKD.  - Follow up as scheduled in 2 weeks. If doing better, he will call to push it back.   Drake Leach PA-C Neurosurgery

## 2022-02-23 ENCOUNTER — Encounter: Payer: Self-pay | Admitting: Orthopedic Surgery

## 2022-02-23 ENCOUNTER — Ambulatory Visit (INDEPENDENT_AMBULATORY_CARE_PROVIDER_SITE_OTHER): Payer: Medicare Other | Admitting: Orthopedic Surgery

## 2022-02-23 DIAGNOSIS — M5416 Radiculopathy, lumbar region: Secondary | ICD-10-CM | POA: Diagnosis not present

## 2022-02-23 DIAGNOSIS — M419 Scoliosis, unspecified: Secondary | ICD-10-CM

## 2022-02-23 DIAGNOSIS — M5126 Other intervertebral disc displacement, lumbar region: Secondary | ICD-10-CM

## 2022-02-23 DIAGNOSIS — M5136 Other intervertebral disc degeneration, lumbar region: Secondary | ICD-10-CM | POA: Diagnosis not present

## 2022-03-05 NOTE — Progress Notes (Unsigned)
Referring Physician:  Barbette Reichmann, MD [address] [facility] [place],  [place] [address]  Primary Physician:  Barbette Reichmann, MD  History of Present Illness: 03/05/2022 [name] has a history of heart murmur, incomplete RBBB, hyperlipidemia. History of CKD per patient.   Last had phone visit with me on 02/23/22 to review his lumbar MRI. He has known  left sided disc at L2-L3 impinging on left L3 nerve root. 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   At last visit, his pain was improving, but he continued with intermittent left buttock pain that radiates into groin and lateral thigh. No pain past his left knee. We discussed lumbar injections and he wanted to hold off. He has done 3 visits with PT ([facility]).   He sent message yesterday that pain was worse after seeing his chiropractor.   ***       Seen by ortho at [facility] on 01/17/22 for left hip pain and was referred here for evaluation of his lumbar spine.   Per ortho note, hip xrays at [facility] showed some medial joint space narrowing superior sclerosis and lateral acetabular osteophyte formation. The right hip has chronic appearing changes consistent with a previous healed femoral neck fracture with impaction joint space narrowing osteophyte formation sclerosis and subchondral cyst formation in the femoral head.   He has intermittent left buttock pain that radiates into groin and lateral thigh x 5-6 months. Pain tends to be worse in the morning and improves as he moves around. No pain past his left knee. Pain is worse with walking. He cannot sleep on left side at night. No pain on right side. No numbness, tingling. Had bad pain on Sunday in left leg and it felt weak.   He has tried neurontin (took 2 pills) with no relief. He does not like to take medications.   Bowel/Bladder Dysfunction: none  Conservative measures:  Physical therapy: 3 visits at [facility] in [place], he  has been seeing chiropractor since April for cervical spine.  Multimodal medical therapy including regular antiinflammatories: neurontin Injections: no epidural steroid injections  Past Surgery: no spinal surgery   Review of Systems:  A 10 point review of systems is negative, except for the pertinent positives and negatives detailed in the HPI.  Past Medical History: Past Medical History:  Diagnosis Date   Allergy     Past Surgical History: Past Surgical History:  Procedure Laterality Date   COLONOSCOPY WITH PROPOFOL N/A 11/05/2014   Procedure: COLONOSCOPY WITH PROPOFOL;  Surgeon: Scot Jun, MD;  Location: [facility] ENDOSCOPY;  Service: Endoscopy;  Laterality: N/A;   INGUINAL HERNIA REPAIR Left    TONSILLECTOMY      Allergies: Allergies as of 03/06/2022 - Review Complete 02/23/2022  Allergen Reaction Noted   Erythromycin Diarrhea 04/17/2021   Shellfish-derived products  11/04/2014   Niacin Hives and Rash 05/19/2020   Penicillin g Rash 11/04/2014   Prednisone Rash 11/04/2014    Medications: Outpatient Encounter Medications as of 03/06/2022  Medication Sig   Ascorbic Acid 500 MG CHEW Take 1 tablet (500 mg total) by mouth once daily   cyanocobalamin 1000 MCG tablet Take 1 tablet (1,000 mcg total) by mouth once daily   Magnesium 200 MG TABS Take 3 tablets by mouth daily.   Menaquinone-7 (K2-45 PO) Take by mouth daily.   Omega-3 Fatty Acids (FISH OIL) 1000 MG CAPS Take 3 capsules by mouth daily.   saw palmetto  160 MG capsule Take 160 mg by mouth 2 (two) times daily.   vitamin E 180 MG (400 UNITS) capsule Take 400 Units by mouth daily.   No facility-administered encounter medications on file as of 03/06/2022.    Social History: Social History   Tobacco Use   Smoking status: Never   Smokeless tobacco: Never  Substance Use Topics   Alcohol use: No   Drug use: No    Family Medical History: Family History  Problem Relation Age of Onset   Stroke Mother    Stroke  Father    Heart attack Father     Physical Examination: There were no vitals filed for this visit.    Awake, alert, oriented to person, place, and time.  Speech is clear and fluent. Fund of knowledge is appropriate.   Cranial Nerves: Pupils equal round and reactive to light.  Facial tone is symmetric.  Facial sensation is symmetric.  ROM of spine:  Good ROM of lumbar spine without pain  No abnormal lesions on exposed skin.   Strength: Side Biceps Triceps Deltoid Interossei Grip Wrist Ext. Wrist Flex.  R 5 5 5 5 5 5 5   L 5 5 5 5 5 5 5    Side Iliopsoas Quads Hamstring PF DF EHL  R 5 5 5 5 5 5   L 5 5 5 5 5 5    Reflexes are 2+ and symmetric at the biceps, triceps, brachioradialis, patella and achilles.   Hoffman's is absent.  Clonus is not present.   Bilateral upper and lower extremity sensation is intact to light touch.     No pain with IR/ER of bilateral hips.   Gait is normal.     Medical Decision Making  Imaging: No recent lumbar imaging.   Assessment and Plan: [name] is a pleasant 71 y.o. male who continues with intermittent left buttock pain that radiates into groin and lateral thigh. No pain past his left knee.   MRI shows left sided disc at L2-L3 impinging on left L3 nerve root. 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 Primary pain generator is likely L2-L3 and L3-L4.   Treatment options discussed with patient and following plan made:   - Continue with PT for lumbar spine.  - Discussed lumbar ESI. He wants to hold off since pain is improving. Can revisit if he gets worse.  - He wants to hold on NSAIDs due to CKD.  - Follow up as scheduled in 2 weeks. If doing better, he will call to push it back.    I spent a total of 30 minutes in face-to-face and non-face-to-face activities related to this patient's care today.  Thank you for involving me in the care of this patient.   PA-C Dept. of Neurosurgery

## 2022-03-06 ENCOUNTER — Encounter: Payer: Self-pay | Admitting: Orthopedic Surgery

## 2022-03-06 ENCOUNTER — Ambulatory Visit (INDEPENDENT_AMBULATORY_CARE_PROVIDER_SITE_OTHER): Payer: Medicare Other | Admitting: Orthopedic Surgery

## 2022-03-06 VITALS — BP 147/80 | HR 76 | Ht 65.5 in | Wt 144.6 lb

## 2022-03-06 DIAGNOSIS — M419 Scoliosis, unspecified: Secondary | ICD-10-CM

## 2022-03-06 DIAGNOSIS — M5416 Radiculopathy, lumbar region: Secondary | ICD-10-CM

## 2022-03-06 DIAGNOSIS — M5126 Other intervertebral disc displacement, lumbar region: Secondary | ICD-10-CM | POA: Diagnosis not present

## 2022-03-06 DIAGNOSIS — M5136 Other intervertebral disc degeneration, lumbar region: Secondary | ICD-10-CM

## 2022-04-17 NOTE — Progress Notes (Signed)
Referring Physician:  Barbette Reichmann, MD [address] [facility] [place],  [place] [address]  Primary Physician:  Barbette Reichmann, MD  History of Present Illness: 04/20/2022 Travis Fernandez has a history of heart murmur, incomplete RBBB, hyperlipidemia. History of CKD per patient.   He has known left sided disc at L2-L3 impinging on left L3 nerve root. 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 Primary pain generator is likely L2-L3 and L3-L4.    Last seen by me on 03/06/22 when he was having a flare up of pain. He was to continue with PT. We discussed injections again and he wanted to hold off.   He is seeing a new Land. He was recently discharged from PT. He is doing great with no current pain! He has no LBP. No leg pain. No numbness, tingling, or weakness.    Conservative measures:  Physical therapy: 6 visits at [facility] in [place], seeing chiropractor Multimodal medical therapy including regular antiinflammatories: neurontin Injections: no epidural steroid injections  Past Surgery: no spinal surgery   Review of Systems:  A 10 point review of systems is negative, except for the pertinent positives and negatives detailed in the HPI.  Past Medical History: Past Medical History:  Diagnosis Date   Allergy     Past Surgical History: Past Surgical History:  Procedure Laterality Date   COLONOSCOPY WITH PROPOFOL N/A 11/05/2014   Procedure: COLONOSCOPY WITH PROPOFOL;  Surgeon: Scot Jun, MD;  Location: [facility] ENDOSCOPY;  Service: Endoscopy;  Laterality: N/A;   INGUINAL HERNIA REPAIR Left    TONSILLECTOMY      Allergies: Allergies as of 04/20/2022 - Review Complete 04/20/2022  Allergen Reaction Noted   Erythromycin Diarrhea 04/17/2021   Shellfish-derived products  11/04/2014   Niacin Hives and Rash 05/19/2020   Penicillin g Rash 11/04/2014   Prednisone Rash 11/04/2014    Medications: Outpatient  Encounter Medications as of 04/20/2022  Medication Sig   Ascorbic Acid 500 MG CHEW Take 1 tablet (500 mg total) by mouth once daily   cyanocobalamin 1000 MCG tablet Take 1 tablet (1,000 mcg total) by mouth once daily   Glucosamine HCl (GLUCOSAMINE PO) Take by mouth 2 (two) times daily.   Magnesium 200 MG TABS Take 3 tablets by mouth daily.   Menaquinone-7 (K2-45 PO) Take by mouth daily.   Omega-3 Fatty Acids (FISH OIL) 1000 MG CAPS Take 3 capsules by mouth daily.   saw palmetto 160 MG capsule Take 160 mg by mouth 2 (two) times daily.   vitamin E 180 MG (400 UNITS) capsule Take 400 Units by mouth daily.   No facility-administered encounter medications on file as of 04/20/2022.    Social History: Social History   Tobacco Use   Smoking status: Never   Smokeless tobacco: Never  Substance Use Topics   Alcohol use: No   Drug use: No    Family Medical History: Family History  Problem Relation Age of Onset   Stroke Mother    Stroke Father    Heart attack Father     Physical Examination: Vitals:   04/20/22 1134  BP: (!) 140/78      Awake, alert, oriented to person, place, and time.  Speech is clear and fluent. Fund of knowledge is appropriate.   Cranial Nerves: Pupils equal round and reactive to light.  Facial tone is symmetric.  Facial sensation is symmetric.  No abnormal lesions on exposed skin.   Strength:  Side Iliopsoas Quads Hamstring PF DF EHL  R 5 5 5 5 5 5   L 5 5 5 5 5 5    Reflexes are 2+ and symmetric at the patella and achilles.    Clonus is not present.   Bilateral lower extremity sensation is intact to light touch.     No pain with IR/ER of bilateral hips.   Gait is normal.     Medical Decision Making  Imaging: No recent lumbar imaging.   Assessment and Plan: Travis Fernandez is a pleasant 72 y.o. male who is doing great! He has no LBP or leg pain. No numbness, tingling, or weakness.   He has known left sided disc at L2-L3 impinging on left L3 nerve root.  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 Primary pain generator is likely L2-L3 and L3-L4.   Treatment options discussed with patient and following plan made:   - Continue with HEP from PT.  - He is still seeing chiropractor- this provider was given a copy of his lumbar MRI.  - He wants to hold on NSAIDs due to CKD.  - Follow as needed.   I spent a total of 15 minutes in face-to-face and non-face-to-face activities related to this patient's care today.  Geronimo Boot PA-C Dept. of Neurosurgery

## 2022-04-20 ENCOUNTER — Ambulatory Visit (INDEPENDENT_AMBULATORY_CARE_PROVIDER_SITE_OTHER): Payer: Medicare Other | Admitting: Orthopedic Surgery

## 2022-04-20 ENCOUNTER — Encounter: Payer: Self-pay | Admitting: Orthopedic Surgery

## 2022-04-20 VITALS — BP 140/78 | Wt 147.2 lb

## 2022-04-20 DIAGNOSIS — M5126 Other intervertebral disc displacement, lumbar region: Secondary | ICD-10-CM

## 2022-04-20 DIAGNOSIS — M5416 Radiculopathy, lumbar region: Secondary | ICD-10-CM

## 2022-04-20 DIAGNOSIS — M5136 Other intervertebral disc degeneration, lumbar region: Secondary | ICD-10-CM

## 2022-04-20 NOTE — Patient Instructions (Addendum)
[facility] [place]  [phone]

## 2022-09-05 NOTE — Telephone Encounter (Signed)
Please reach out to schedule him a f/u appt with me for recheck lumbar.

## 2022-09-07 NOTE — Progress Notes (Unsigned)
Referring Physician:  Barbette Reichmann, MD [address] [facility] [place],  [place] [address]  Primary Physician:  Barbette Reichmann, MD  History of Present Illness: 09/07/2022 [name] has a history of heart murmur, incomplete RBBB, hyperlipidemia. History of CKD per patient.   He has known left sided disc at L2-L3 impinging on left L3 nerve root. 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 Primary pain generator is likely L2-L3 and L3-L4.    Last seen by me on 04/20/22 and he was doing well with no back or leg pain.   He sent a recent message on MyChart complaining of left hip pain. He is here for further evaluation.   He is having intermittent left lateral hip pain that can be worse with walking. He has difficulty sleeping on left side. He is not hurting today. No LBP or radicular leg pain.  He has done PT for his back and he continues to see a Land.   He has known left hip OA.    Conservative measures:  Physical therapy: 6 visits at [facility] in [place], seeing chiropractor Multimodal medical therapy including regular antiinflammatories: neurontin Injections: no epidural steroid injections  Past Surgery: no spinal surgery   Review of Systems:  A 10 point review of systems is negative, except for the pertinent positives and negatives detailed in the HPI.  Past Medical History: Past Medical History:  Diagnosis Date   Allergy     Past Surgical History: Past Surgical History:  Procedure Laterality Date   COLONOSCOPY WITH PROPOFOL N/A 11/05/2014   Procedure: COLONOSCOPY WITH PROPOFOL;  Surgeon: Scot Jun, MD;  Location: [facility] ENDOSCOPY;  Service: Endoscopy;  Laterality: N/A;   INGUINAL HERNIA REPAIR Left    TONSILLECTOMY      Allergies: Allergies as of 09/10/2022 - Review Complete 04/20/2022  Allergen Reaction Noted   Erythromycin Diarrhea 04/17/2021   Shellfish-derived products   11/04/2014   Niacin Hives and Rash 05/19/2020   Penicillin g Rash 11/04/2014   Prednisone Rash 11/04/2014    Medications: Outpatient Encounter Medications as of 09/10/2022  Medication Sig   Ascorbic Acid 500 MG CHEW Take 1 tablet (500 mg total) by mouth once daily   cyanocobalamin 1000 MCG tablet Take 1 tablet (1,000 mcg total) by mouth once daily   Glucosamine HCl (GLUCOSAMINE PO) Take by mouth 2 (two) times daily.   Magnesium 200 MG TABS Take 3 tablets by mouth daily.   Menaquinone-7 (K2-45 PO) Take by mouth daily.   Omega-3 Fatty Acids (FISH OIL) 1000 MG CAPS Take 3 capsules by mouth daily.   saw palmetto 160 MG capsule Take 160 mg by mouth 2 (two) times daily.   vitamin E 180 MG (400 UNITS) capsule Take 400 Units by mouth daily.   No facility-administered encounter medications on file as of 09/10/2022.    Social History: Social History   Tobacco Use   Smoking status: Never   Smokeless tobacco: Never  Substance Use Topics   Alcohol use: No   Drug use: No    Family Medical History: Family History  Problem Relation Age of Onset   Stroke Mother    Stroke Father    Heart attack Father     Physical Examination: There were no vitals filed for this visit.     Awake, alert, oriented to person, place, and time.  Speech is clear and fluent. Fund of knowledge is appropriate.   Cranial Nerves:  Pupils equal round and reactive to light.  Facial tone is symmetric.  Facial sensation is symmetric.  No abnormal lesions on exposed skin.   Strength:  Side Iliopsoas Quads Hamstring PF DF EHL  R 5 5 5 5 5 5   L 5 5 5 5 5 5    Reflexes are 2+ and symmetric at the patella and achilles.    Clonus is not present.   Bilateral lower extremity sensation is intact to light touch.     No pain with IR/ER of bilateral hips.   He has point tenderness over left greater trochanteric bursa.   Gait is normal.     Medical Decision Making  Imaging: No recent lumbar imaging.   Assessment  and Plan: [name] is a pleasant 72 y.o. male who has left lateral hip pain consistent with left greater trochanteric bursitis. No LBP or radicular leg pain. Pain is waxing and waning- he hasn't had any pain for the last few days.   He has known left sided disc at L2-L3 impinging on left L3 nerve root. 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 As above, current pain appears to be due to left GTB.   Treatment options discussed with patient and following plan made:   - Recommend he follow up with ortho for treatment of left greater trochanteric bursitis.  - He wants to review with his chiropractor.  - He will let me know if needs referral to ortho- he has seen ortho at [facility] previously.  - Follow up as needed.   I spent a total of 20 minutes in face-to-face and non-face-to-face activities related to this patient's care today.  Drake Leach PA-C Dept. of Neurosurgery

## 2022-09-10 ENCOUNTER — Ambulatory Visit: Payer: Medicare Other | Admitting: Orthopedic Surgery

## 2022-09-10 ENCOUNTER — Encounter: Payer: Self-pay | Admitting: Orthopedic Surgery

## 2022-09-10 ENCOUNTER — Ambulatory Visit (INDEPENDENT_AMBULATORY_CARE_PROVIDER_SITE_OTHER): Payer: Medicare Other | Admitting: Orthopedic Surgery

## 2022-09-10 VITALS — BP 140/88 | Ht 65.0 in | Wt 143.8 lb

## 2022-09-10 DIAGNOSIS — M7062 Trochanteric bursitis, left hip: Secondary | ICD-10-CM | POA: Diagnosis not present

## 2022-09-10 NOTE — Patient Instructions (Signed)
It was so nice to see you today. Thank you so much for coming in.   I think the pain in your left hip is from greater trochanteric bursitis.   Talk to your chiropractor about this, she may have some stretches for you to do.   If pain persists or gets worse, then you can see ortho at [facility] to discuss a possible injection.   Please do not hesitate to call if you have any questions or concerns. You can also message me in MyChart.   It is always so good to see you both!!!!!  Drake Leach PA-C [phone]

## 2022-09-16 DIAGNOSIS — M7062 Trochanteric bursitis, left hip: Secondary | ICD-10-CM

## 2022-09-17 NOTE — Addendum Note (Signed)
Addended byDrake Leach on: 09/17/2022 12:23 PM   Modules accepted: Orders

## 2022-09-17 NOTE — Telephone Encounter (Signed)
Referral for physiatry done.

## 2022-09-17 NOTE — Telephone Encounter (Signed)
Patient called [facility] and was told he needs to see [facility] physiatry. He call [facility] physiatry and was told he needs a referral. Can you please send a referral to [facility] physiatry.

## 2022-12-06 ENCOUNTER — Ambulatory Visit: Payer: Medicare Other

## 2022-12-06 DIAGNOSIS — K2100 Gastro-esophageal reflux disease with esophagitis, without bleeding: Secondary | ICD-10-CM | POA: Diagnosis not present

## 2022-12-06 DIAGNOSIS — K317 Polyp of stomach and duodenum: Secondary | ICD-10-CM | POA: Diagnosis present

## 2023-04-26 IMAGING — CT CT CARDIAC CORONARY ARTERY CALCIUM SCORE
3 series · 14 of 20 positions shown, 16 images · non-contrast
Comparison: Renal ultrasound on 08/26/2017
COMPARISON: Renal ultrasound on 08/26/2017

Addendum:
EXAM:
OVER-READ INTERPRETATION  CT CHEST

The following report is an over-read performed by radiologist Dr.
Raffaella Dickens [REDACTED] on 11/02/2020. This
over-read does not include interpretation of cardiac or coronary
anatomy or pathology. The coronary calcium score interpretation by
the cardiologist is attached.
CLINICAL DATA: Risk stratification
Coronary Calcium Score
TECHNIQUE: The patient was scanned on a Siemens Somatom go.Top Scanner. Axial
non-contrast 3 mm slices were carried out through the heart. The
data set was analyzed on a dedicated work station and scored using
the Agatson method.

[Series 2: sa36 calcium scoring 3.00 · axial · 0.38mm/px · z∈[-1115,-1034]mm · 4 of 46 slices shown]
[im 10/46  vessel]
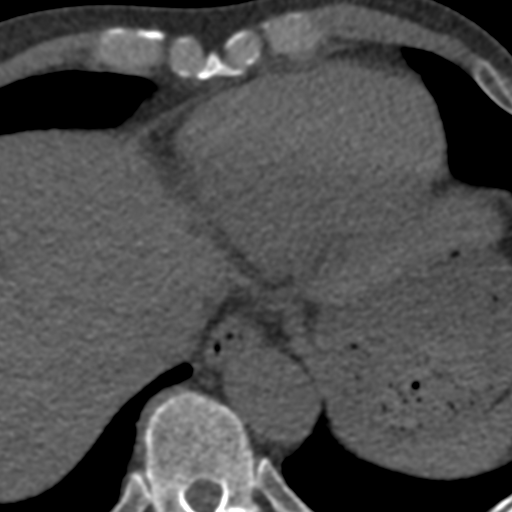
[im 19/46  vessel]
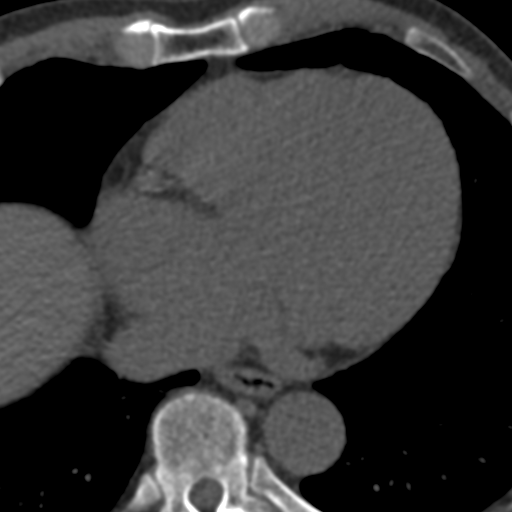
[im 28/46  vessel]
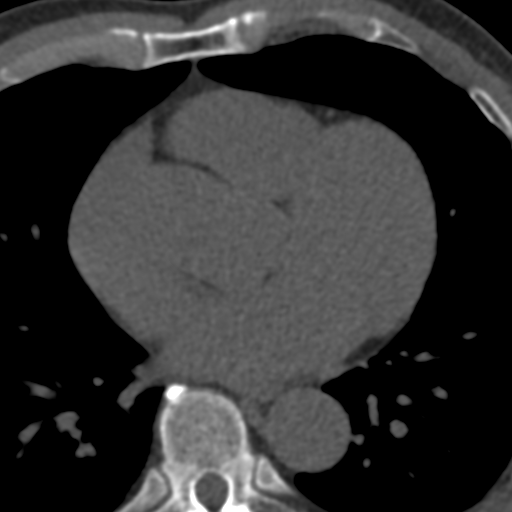
[im 37/46  vessel]
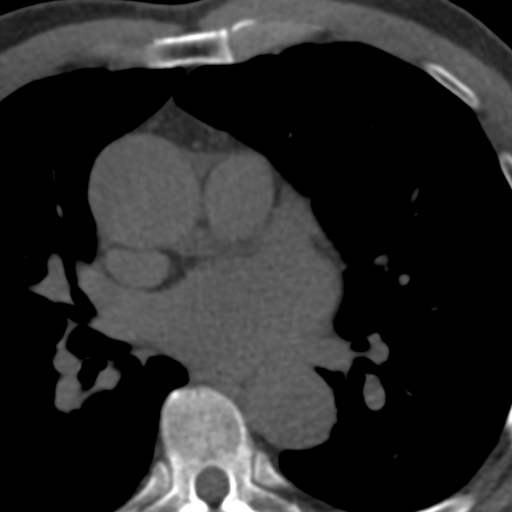

[Series 5: full fov st calcium scoring 3.00 · axial · 0.70mm/px · z∈[-1121,-1031]mm · 5 of 46 slices shown, 7 images]
[im 8/46  vessel]
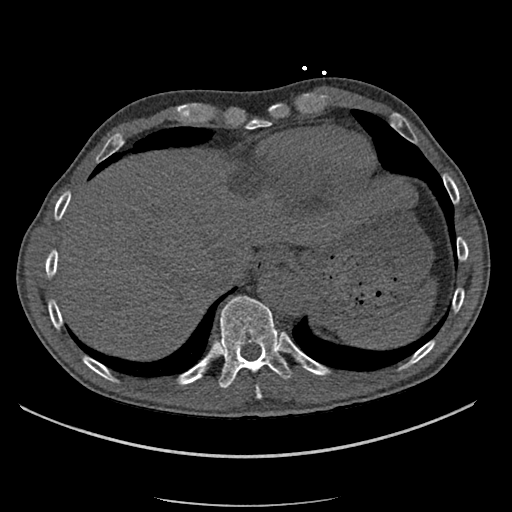
[im 8/46  lung]
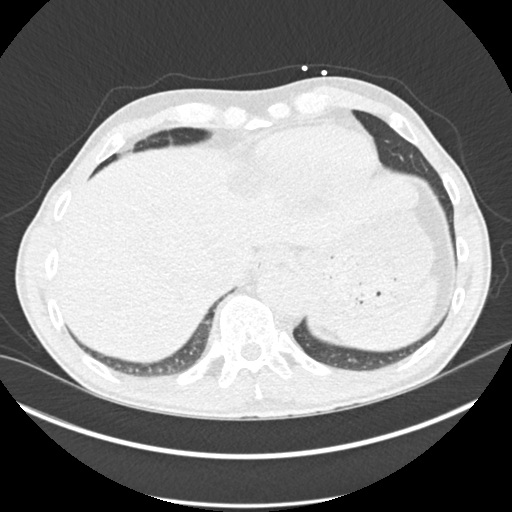
[im 16/46  vessel]
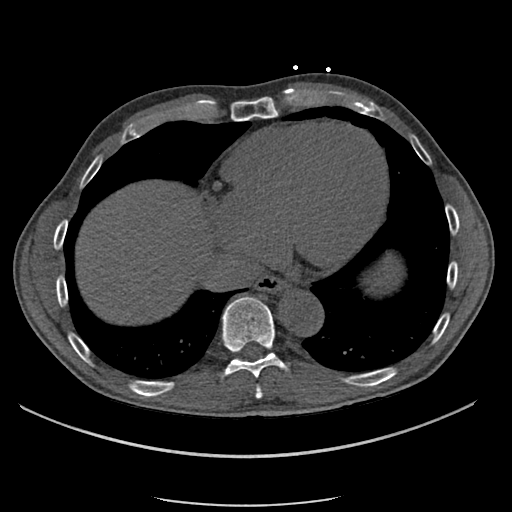
[im 23/46  vessel]
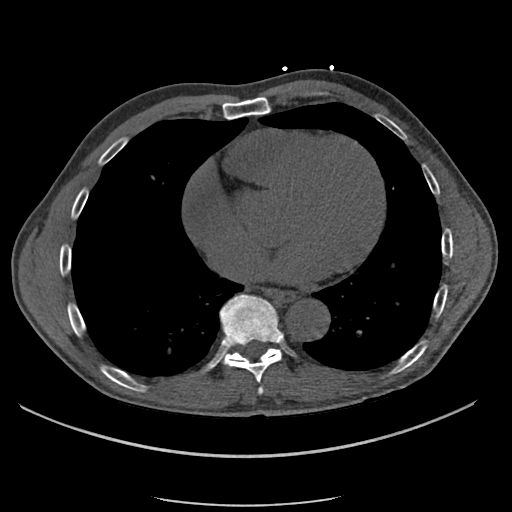
[im 31/46  vessel]
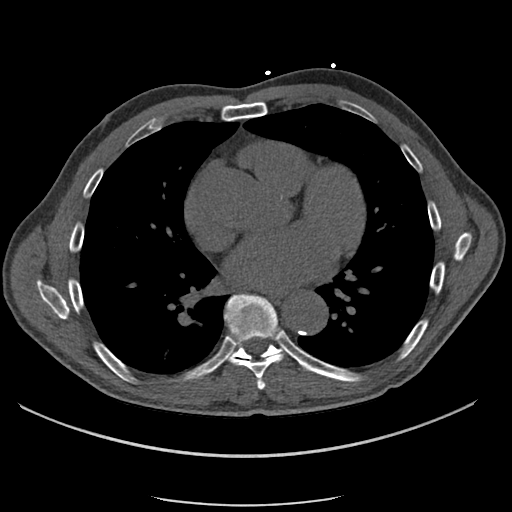
[im 38/46  vessel]
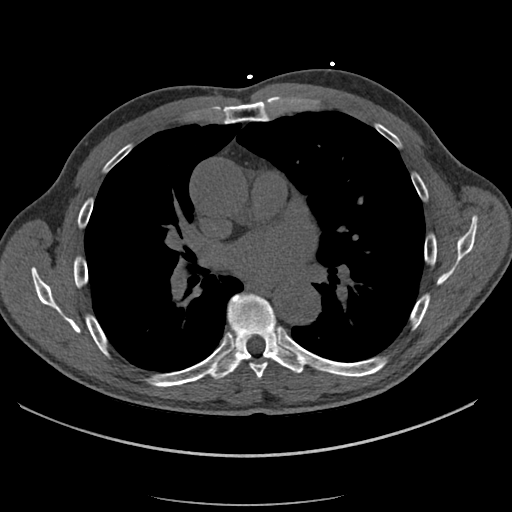
[im 38/46  lung]
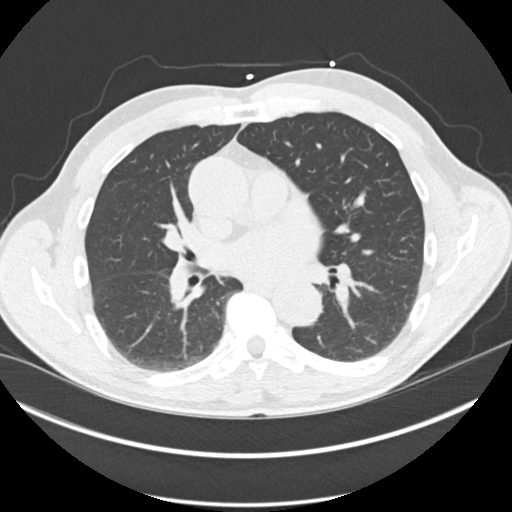

[Series 10: full fov lungs calcium scoring 3.00 ax · axial · 0.70mm/px · z∈[-1121,-1031]mm · 5 of 46 slices shown]
[im 8/46  vessel]
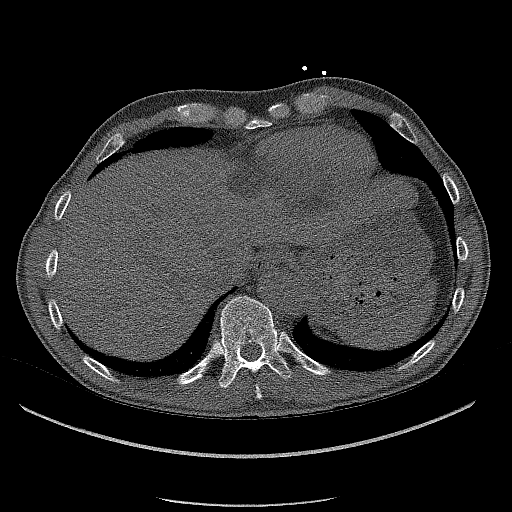
[im 16/46  vessel]
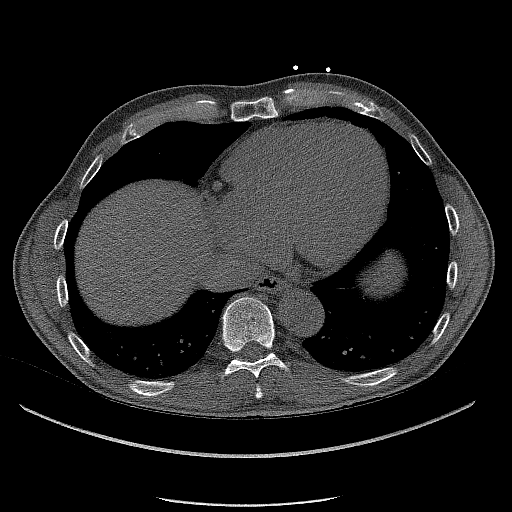
[im 23/46  vessel]
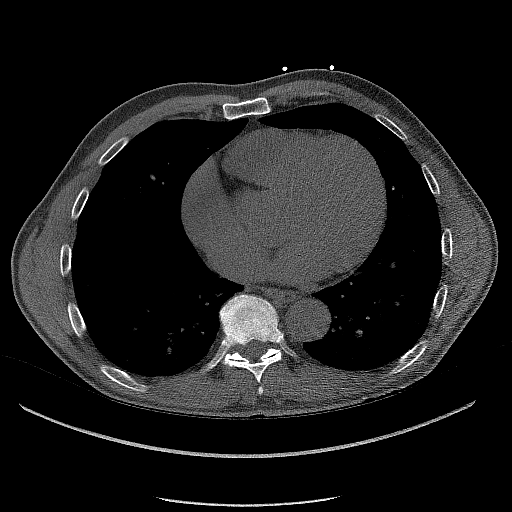
[im 31/46  vessel]
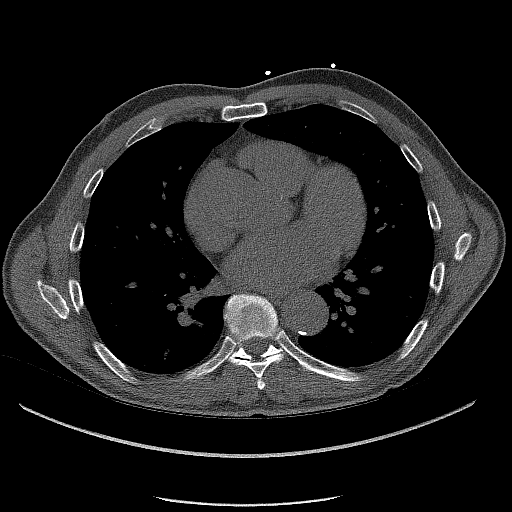
[im 38/46  vessel]
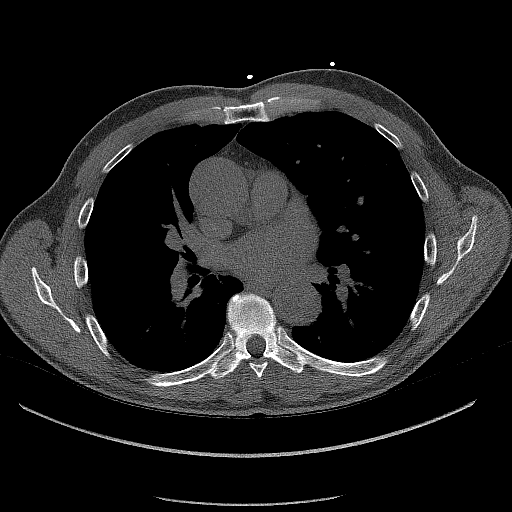

[14 of 20 positions shown; findings below may reference images not displayed]

FINDINGS: Vascular: The ascending thoracic aorta is mildly dilated and
measures up to 4.1-4.2 cm in greatest diameter. The descending
thoracic aorta is also mildly dilated proximally measuring
approximately 3.2 cm in greatest diameter.

Mediastinum/Nodes: Visualized mediastinum and hilar regions
demonstrate no lymphadenopathy or masses.

Lungs/Pleura: Visualized lungs show no evidence of pulmonary edema,
consolidation, pneumothorax, nodule or pleural fluid.

Upper Abdomen: Multiple hepatic cysts. Large cyst abutting the liver
likely emanates from the upper pole of the right kidney and has been
previously demonstrated by renal ultrasound.

Musculoskeletal: No chest wall mass or suspicious bone lesions
identified.
IMPRESSION: Mild dilatation of the ascending thoracic aorta measuring up to
4.1-4.2 cm. The visualized proximal descending thoracic aorta is
also mildly dilated measuring up to 3.2 cm. Recommend annual imaging
followup by CTA or MRA. This recommendation follows 5575
ACCF/AHA/AATS/ACR/ASA/SCA/BRIDGEWATER/GZ/LAGOSI/ZANDOVAL Guidelines for the
Diagnosis and Management of Patients with Thoracic Aortic Disease.
Circulation. 5575; 121: [id number]. Aortic aneurysm NOS (U4BG9-GGW.O)
FINDINGS: Non-cardiac: See separate report from [REDACTED].

Ascending Aorta: Normal size

Pericardium: Normal

Coronary arteries: Normal origin of left and right coronary
arteries. Distribution of arterial calcifications if present, as
noted below;

LM 0

LAD

LCx 0

RCA

Total 103

IMPRESSION AND RECOMMENDATION:
1. Coronary calcium score of 103. This was 48th percentile for age
and sex matched control.

2. CAC 100-299 in LAD, RCA   NITHESH CRISOSTOMO2/N2.

3. Recommend aspirin and statin if no contraindication.

4. Continue heart healthy lifestyle and risk factor modification.

Fanya Jeries

*** End of Addendum ***
EXAM:
OVER-READ INTERPRETATION  CT CHEST

The following report is an over-read performed by radiologist Dr.
Raffaella Dickens [REDACTED] on 11/02/2020. This
over-read does not include interpretation of cardiac or coronary
anatomy or pathology. The coronary calcium score interpretation by
the cardiologist is attached.
FINDINGS: Vascular: The ascending thoracic aorta is mildly dilated and
measures up to 4.1-4.2 cm in greatest diameter. The descending
thoracic aorta is also mildly dilated proximally measuring
approximately 3.2 cm in greatest diameter.

Mediastinum/Nodes: Visualized mediastinum and hilar regions
demonstrate no lymphadenopathy or masses.

Lungs/Pleura: Visualized lungs show no evidence of pulmonary edema,
consolidation, pneumothorax, nodule or pleural fluid.

Upper Abdomen: Multiple hepatic cysts. Large cyst abutting the liver
likely emanates from the upper pole of the right kidney and has been
previously demonstrated by renal ultrasound.

Musculoskeletal: No chest wall mass or suspicious bone lesions
identified.
IMPRESSION: Mild dilatation of the ascending thoracic aorta measuring up to
4.1-4.2 cm. The visualized proximal descending thoracic aorta is
also mildly dilated measuring up to 3.2 cm. Recommend annual imaging
followup by CTA or MRA. This recommendation follows 5575
ACCF/AHA/AATS/ACR/ASA/SCA/BRIDGEWATER/GZ/LAGOSI/ZANDOVAL Guidelines for the
Diagnosis and Management of Patients with Thoracic Aortic Disease.
Circulation. 5575; 121: [id number]. Aortic aneurysm NOS (U4BG9-GGW.O)

## 2023-05-25 IMAGING — US US RENAL
2 series · 14 of 25 positions shown · non-contrast
Comparison: Renal ultrasound 08/26/2017

CLINICAL DATA: Increased urinary frequency.

EXAM:
RENAL / URINARY TRACT ULTRASOUND COMPLETE

[Series 1: us renal · 13 of 49 slices shown]
[im 1/49]
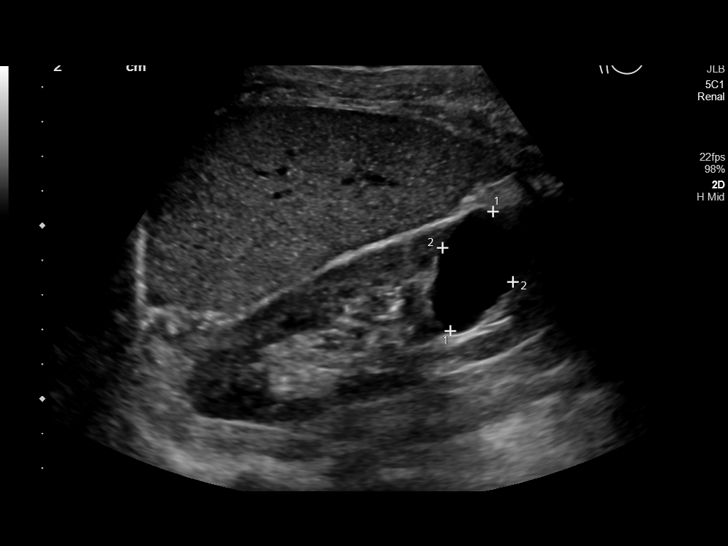
[im 5/49]
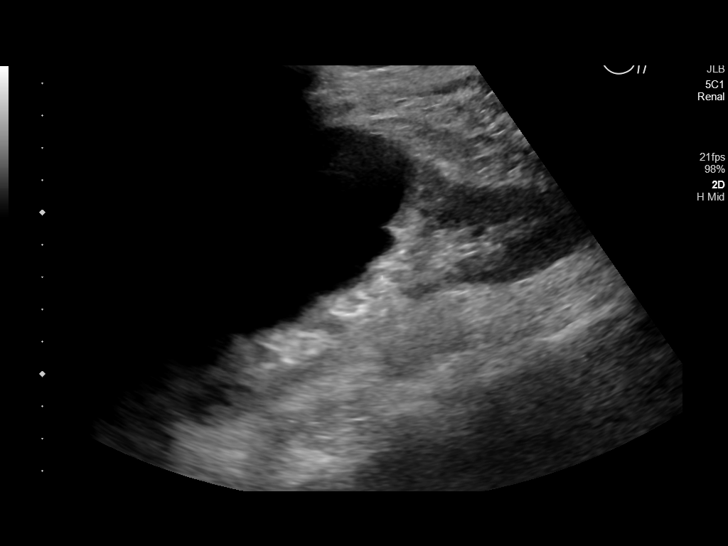
[im 9/49]
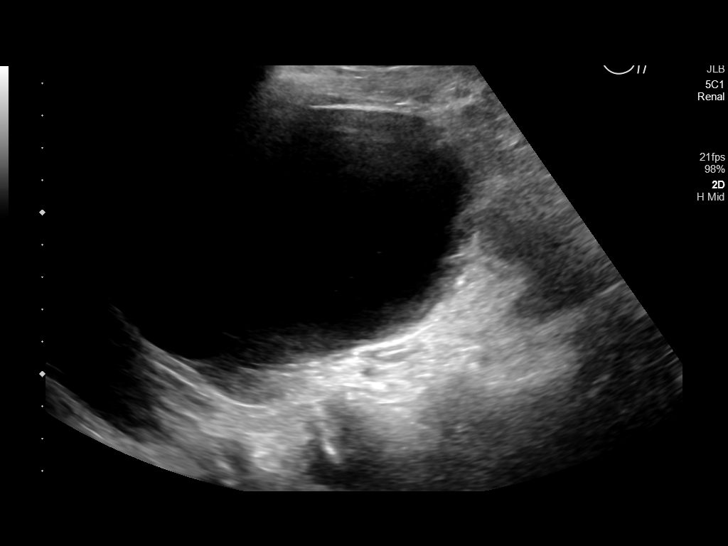
[im 13/49]
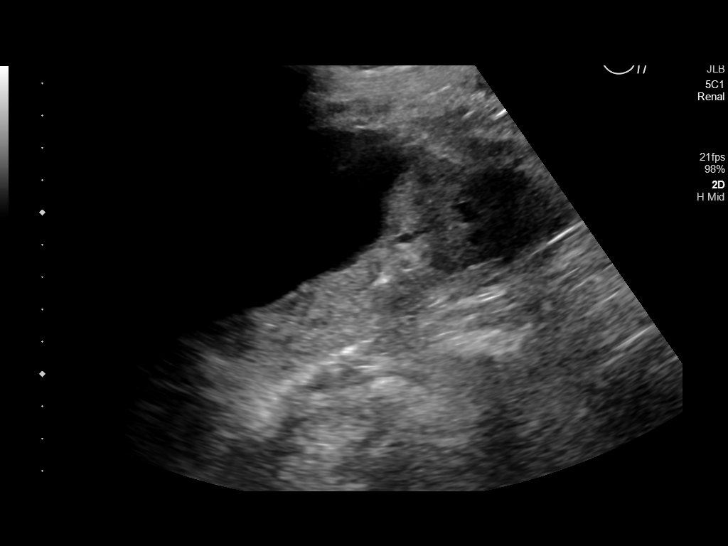
[im 17/49]
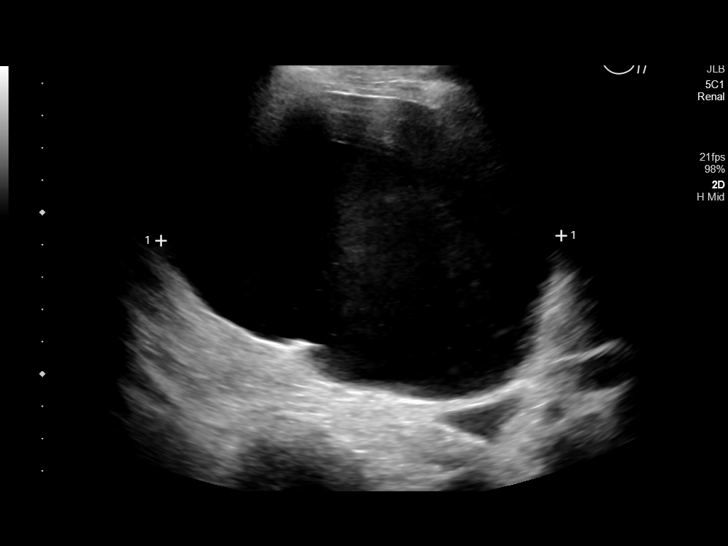
[im 19/49]
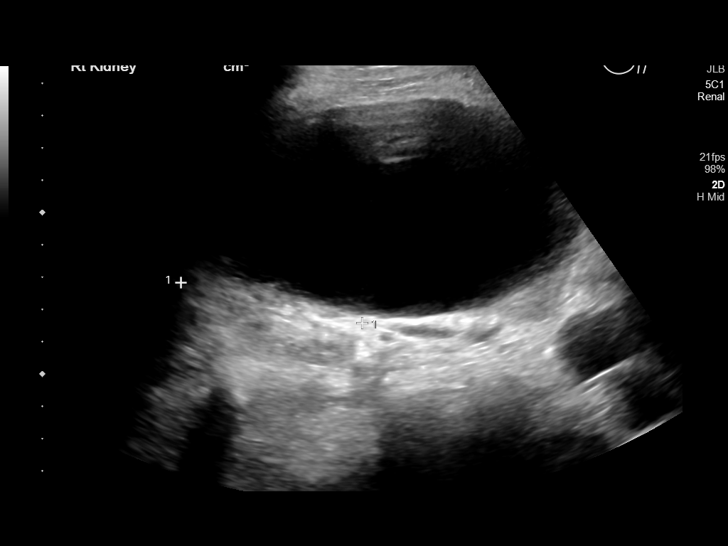
[im 23/49]
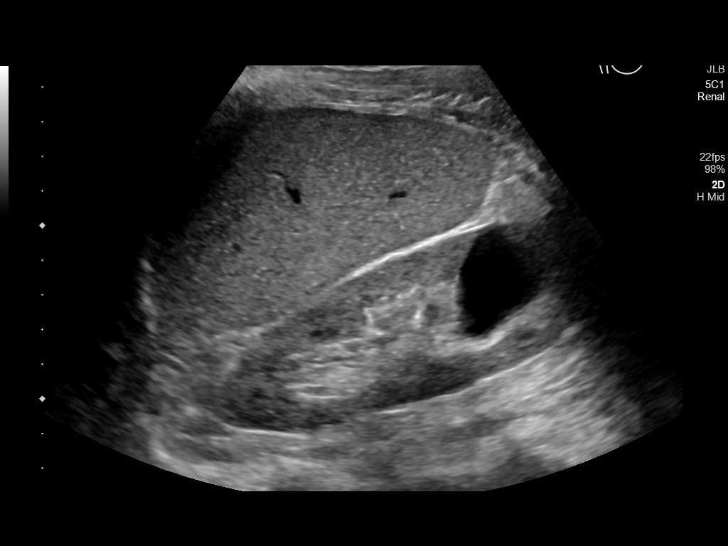
[im 28/49]
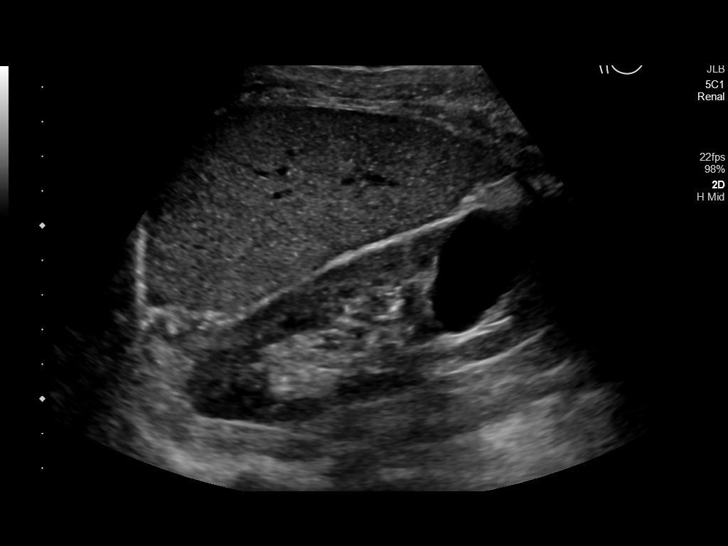
[im 32/49]
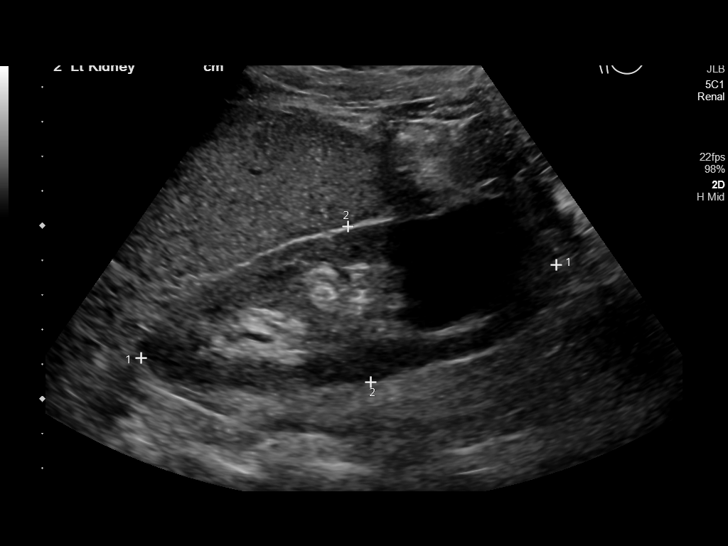
[im 34/49]
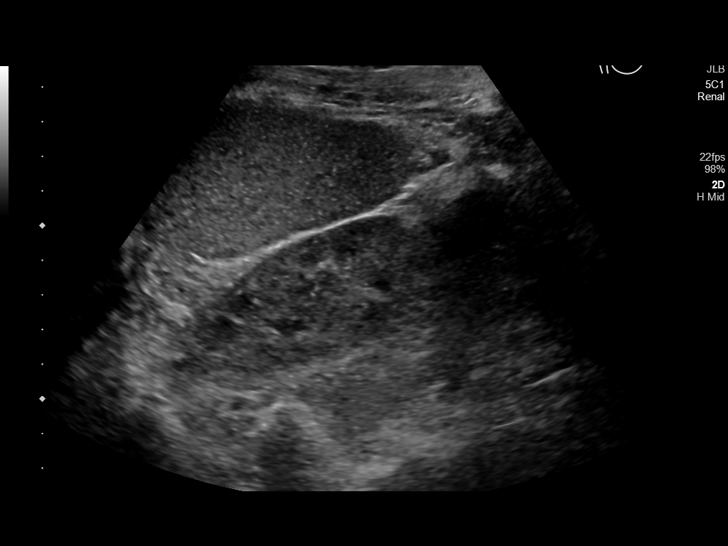
[im 38/49]
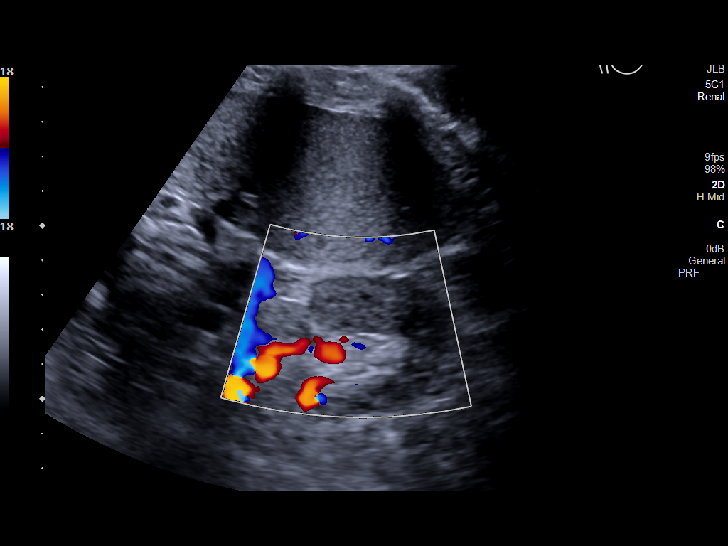
[im 42/49]
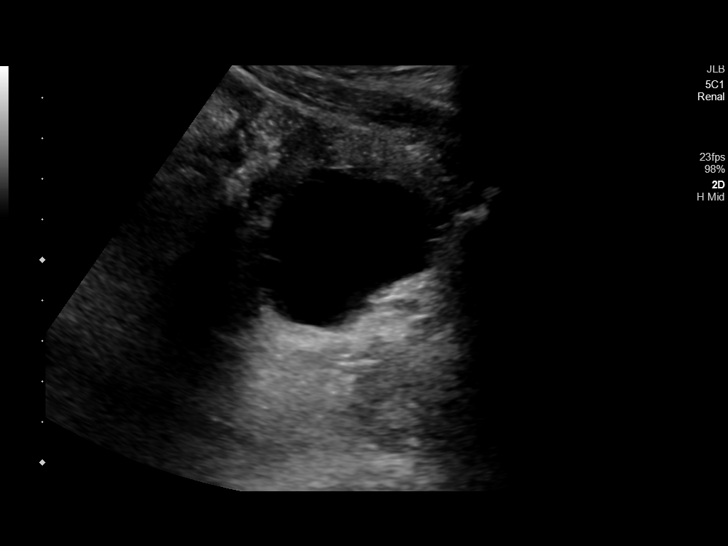
[im 46/49]
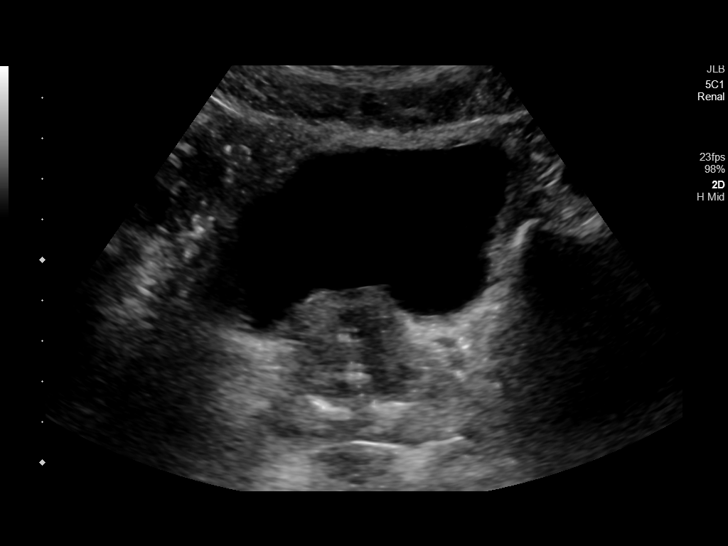

[Series 1001: renal · 1 of 2 slices shown]
[im 1/2]
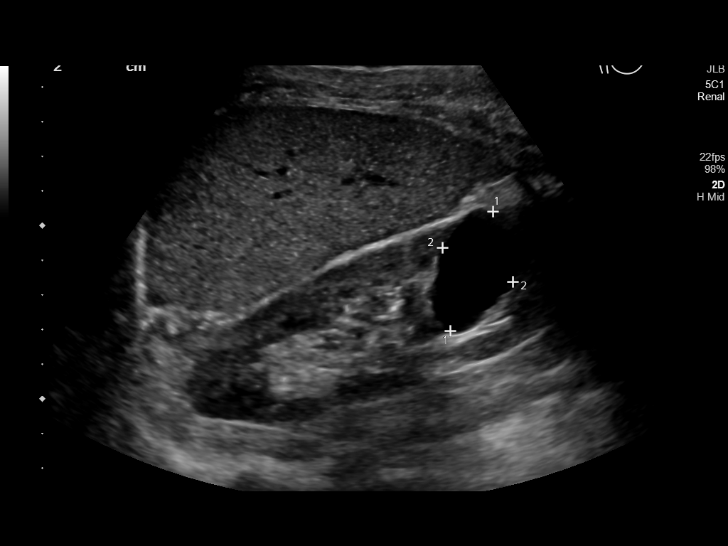

[14 of 25 positions shown; findings below may reference images not displayed]

FINDINGS: Right Kidney:

Renal measurements: 13.7 x 6.3 x 5.8 cm = volume: 260 mL. Large
central/parapelvic cyst measuring 11.7 x 9.7 x 12.4 cm. This is not
significantly changed in size from prior exam. There is no internal
complexity. No hydronephrosis. Normal parenchymal echogenicity.

Left Kidney:

Renal measurements: 12.3 x 4.5 x 4.1 cm = volume: 119 mL. No
hydronephrosis. Cyst arising from the lower pole measures 3.7 x
x 2.4 cm. No solid lesion or stone. Normal parenchymal echogenicity.

Bladder:

Appears normal for degree of bladder distention.

Other:

Prostate gland causes mild mass effect on the bladder base.
IMPRESSION: 1. Similar appearance of simple bilateral renal cysts, including a
large 11 cm cyst in the mid right kidney. No suspicious renal
lesion.
2. Prominent prostate gland causes mild mass effect on the bladder
base.

## 2024-01-11 IMAGING — US US EXTREM LOW*R* LIMITED
1 series · 14 of 16 positions shown · non-contrast
Comparison: None.

CLINICAL DATA: Right inguinal pain

EXAM:
ULTRASOUND RIGHT LOWER EXTREMITY LIMITED
TECHNIQUE: Ultrasound examination of the lower extremity soft tissues was
performed in the area of clinical concern.

[Series 1: us soft tissue lower extremity limited right (non- · 16 acquisitions, 14 frames shown]
[im 1/16]
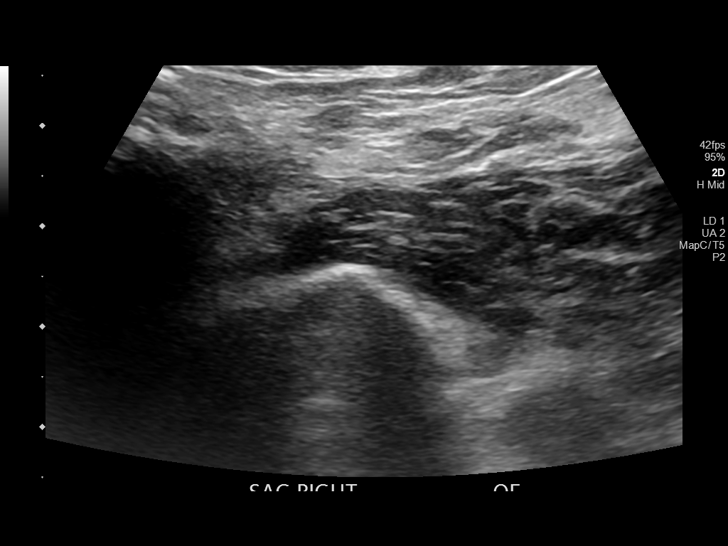
[im 2/16]
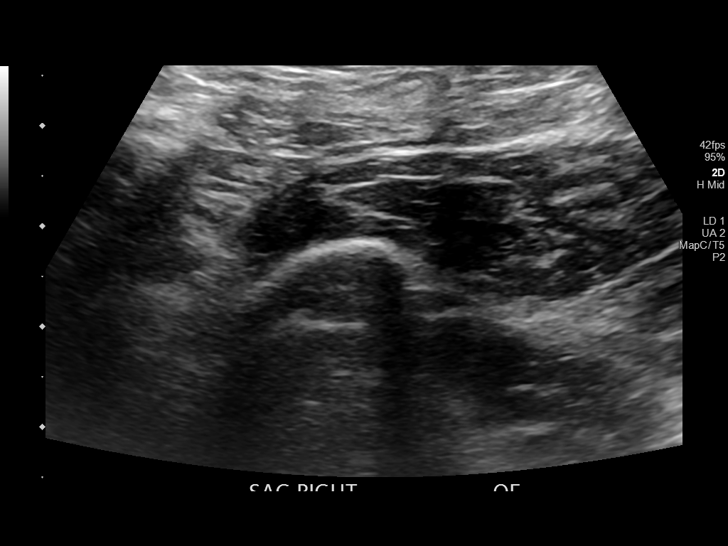
[im 3/16]
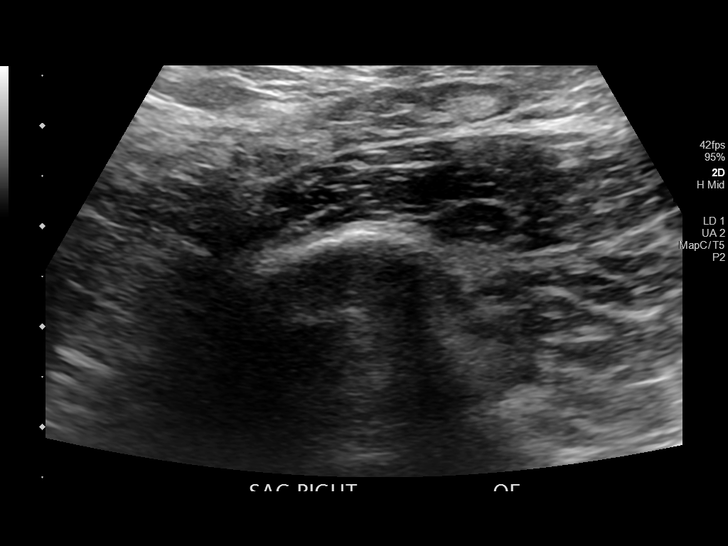
[im 5/16]
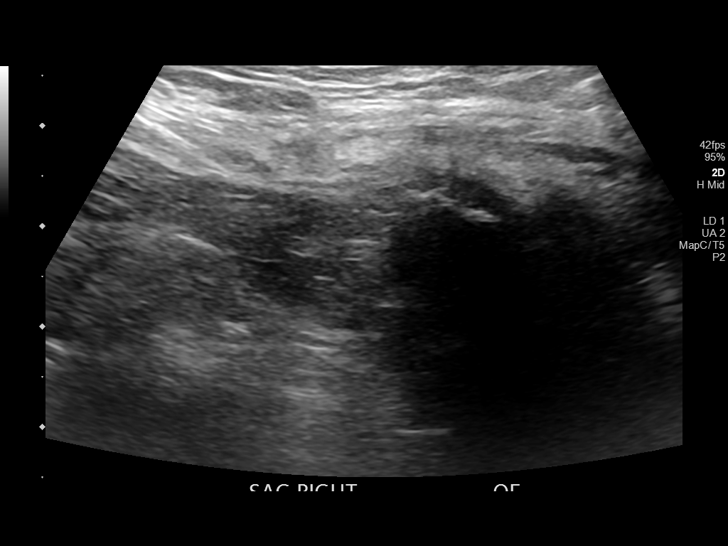
[im 6/16]
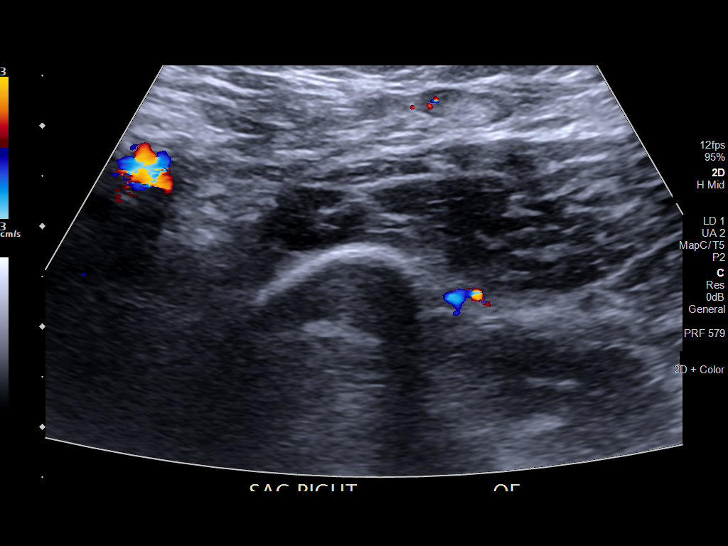
[im 7/16]
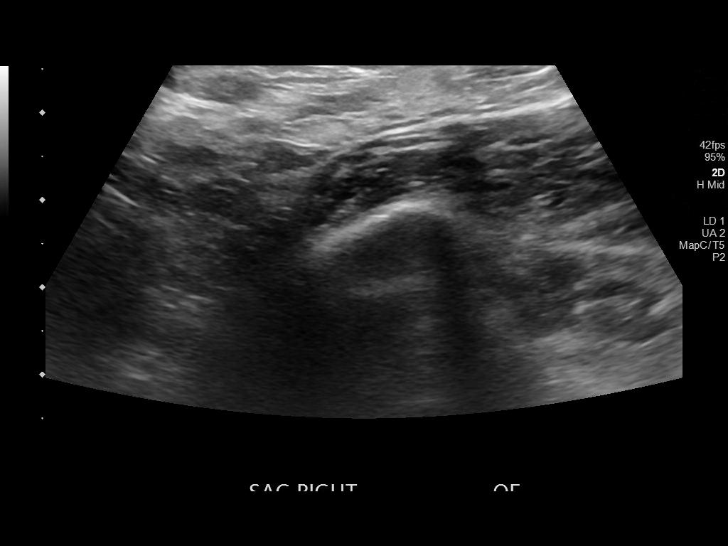
[im 8/16]
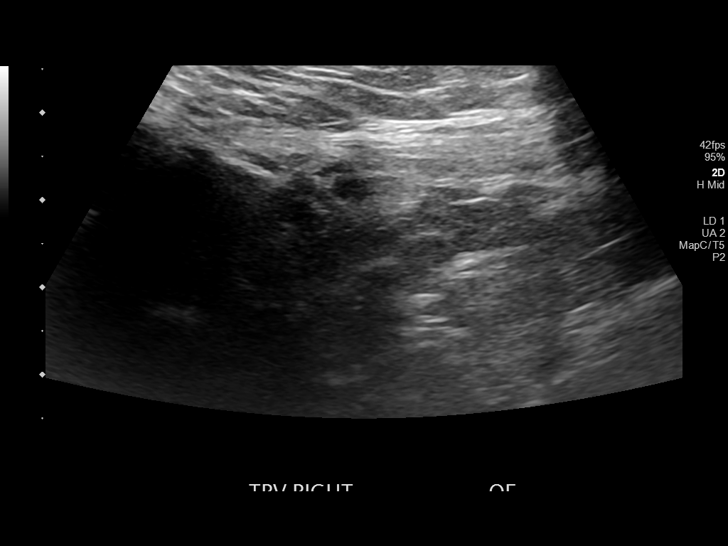
[im 9/16]
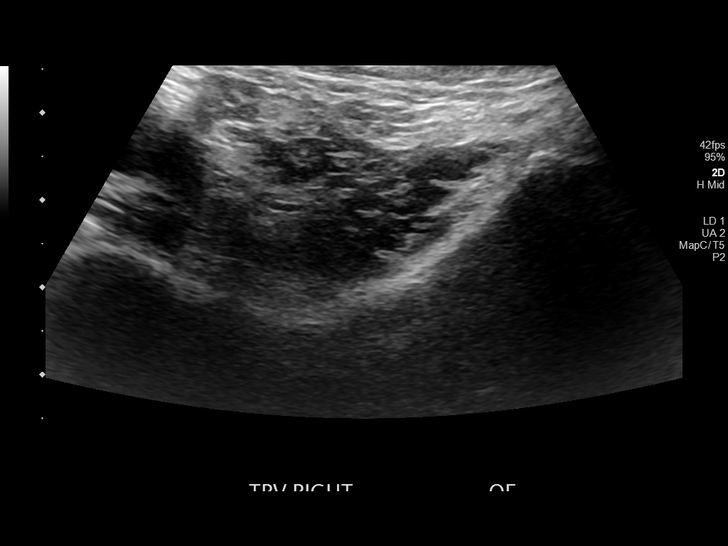
[im 10/16]
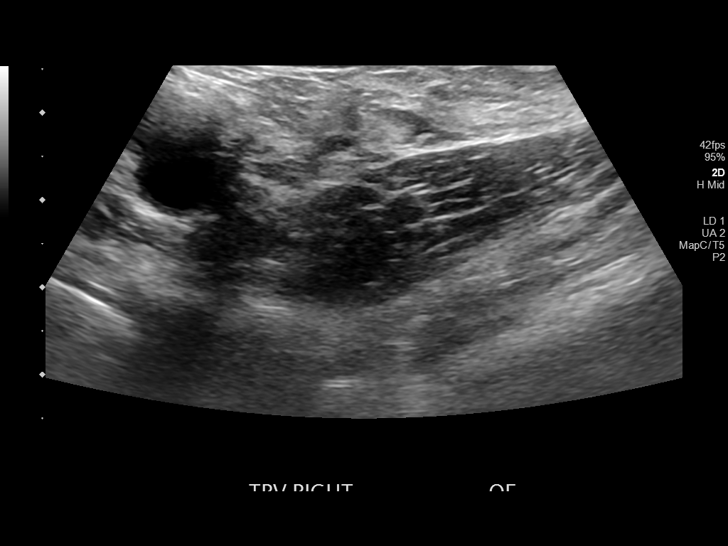
[im 11/16]
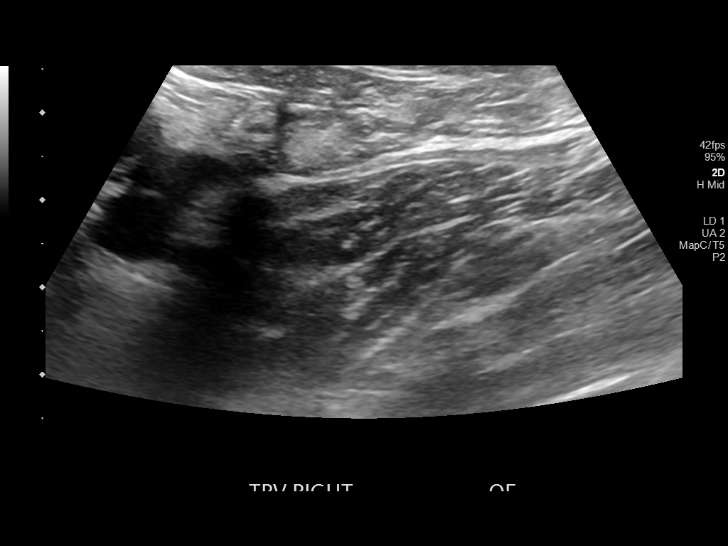
[im 13/16]
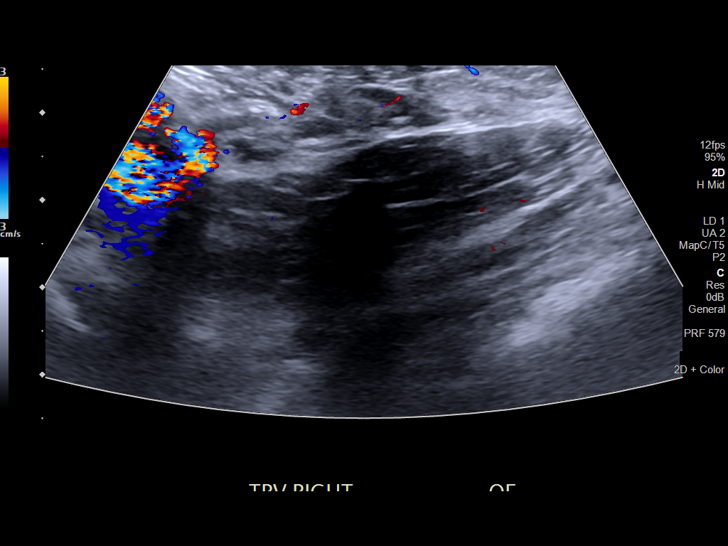
[im 14/16]
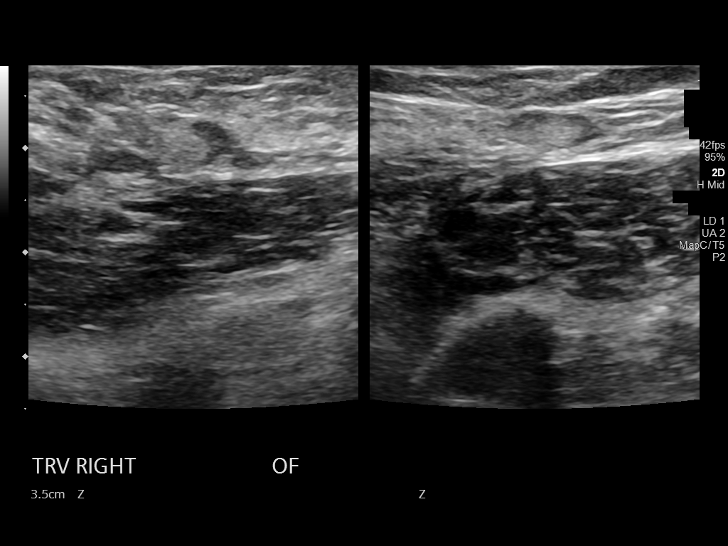
[im 15/16]
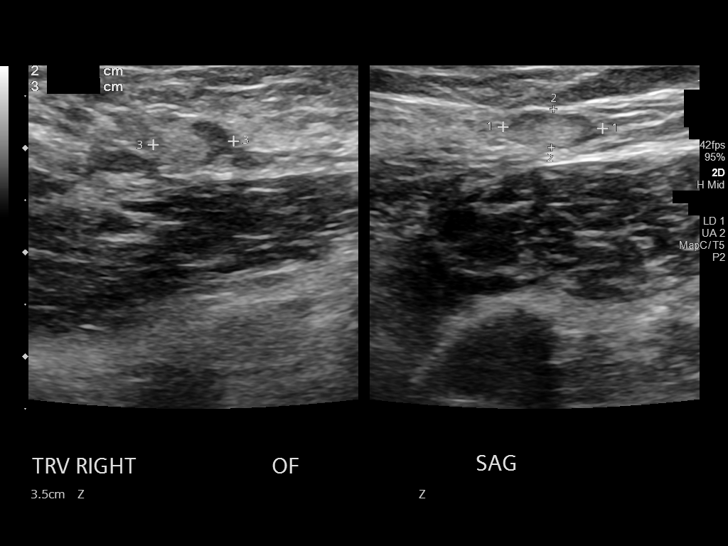
[im 16/16]
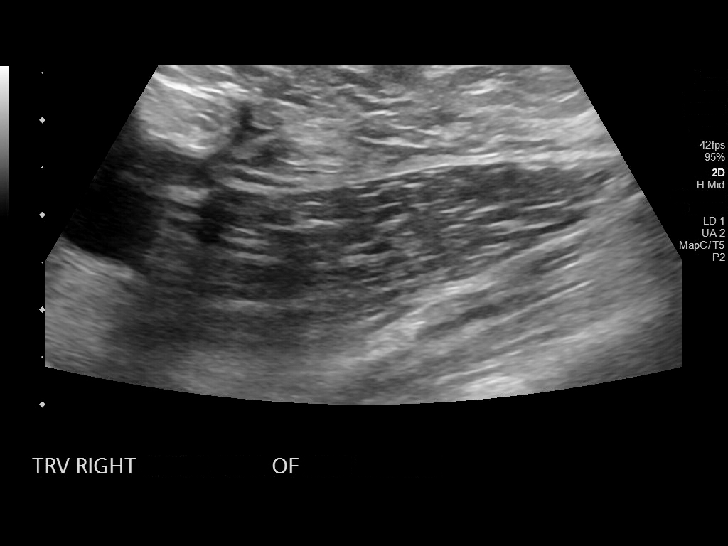

[14 of 16 positions shown; findings below may reference images not displayed]

FINDINGS: Sonographic evaluation of the painful area in the right inguinal
region was performed. There is a normal appearing 10 x 4 x 8 mm
lymph node. No pathologic adenopathy. No free fluid or mass lesion.
No evidence of inguinal hernia.
IMPRESSION: 1. Unremarkable right inguinal ultrasound.
